# Patient Record
Sex: Female | Born: 1995 | Race: White | Hispanic: No | Marital: Married | Smoking: Never smoker
Health system: Southern US, Community
[De-identification: ages and names within clinical notes are randomized; demographics above are authoritative.]

## PROBLEM LIST (undated history)

## (undated) DIAGNOSIS — I471 Supraventricular tachycardia, unspecified: Secondary | ICD-10-CM

## (undated) DIAGNOSIS — F32A Depression, unspecified: Secondary | ICD-10-CM

## (undated) DIAGNOSIS — F419 Anxiety disorder, unspecified: Secondary | ICD-10-CM

## (undated) HISTORY — PX: CLEFT LIP REPAIR: SUR1164

## (undated) HISTORY — DX: Depression, unspecified: F32.A

## (undated) HISTORY — PX: TYMPANOSTOMY TUBE PLACEMENT: SHX32

## (undated) HISTORY — DX: Supraventricular tachycardia: I47.1

## (undated) HISTORY — PX: CLEFT PALATE REPAIR: SUR1165

## (undated) HISTORY — DX: Supraventricular tachycardia, unspecified: I47.10

## (undated) HISTORY — PX: TONSILLECTOMY AND ADENOIDECTOMY: SUR1326

## (undated) HISTORY — DX: Anxiety disorder, unspecified: F41.9

---

## 2013-01-27 DIAGNOSIS — K589 Irritable bowel syndrome without diarrhea: Secondary | ICD-10-CM | POA: Insufficient documentation

## 2016-08-02 DIAGNOSIS — F32A Depression, unspecified: Secondary | ICD-10-CM | POA: Insufficient documentation

## 2016-08-02 DIAGNOSIS — F329 Major depressive disorder, single episode, unspecified: Secondary | ICD-10-CM | POA: Insufficient documentation

## 2016-08-30 DIAGNOSIS — D509 Iron deficiency anemia, unspecified: Secondary | ICD-10-CM | POA: Insufficient documentation

## 2016-12-07 DIAGNOSIS — Z8349 Family history of other endocrine, nutritional and metabolic diseases: Secondary | ICD-10-CM | POA: Insufficient documentation

## 2016-12-20 DIAGNOSIS — G43019 Migraine without aura, intractable, without status migrainosus: Secondary | ICD-10-CM | POA: Insufficient documentation

## 2017-09-12 DIAGNOSIS — G43109 Migraine with aura, not intractable, without status migrainosus: Secondary | ICD-10-CM | POA: Insufficient documentation

## 2018-02-15 DIAGNOSIS — E559 Vitamin D deficiency, unspecified: Secondary | ICD-10-CM | POA: Insufficient documentation

## 2018-02-15 DIAGNOSIS — K529 Noninfective gastroenteritis and colitis, unspecified: Secondary | ICD-10-CM | POA: Insufficient documentation

## 2019-06-13 HISTORY — PX: ABLATION: SHX5711

## 2019-09-10 DIAGNOSIS — D649 Anemia, unspecified: Secondary | ICD-10-CM | POA: Insufficient documentation

## 2019-09-10 DIAGNOSIS — N838 Other noninflammatory disorders of ovary, fallopian tube and broad ligament: Secondary | ICD-10-CM | POA: Insufficient documentation

## 2019-09-10 DIAGNOSIS — N926 Irregular menstruation, unspecified: Secondary | ICD-10-CM | POA: Insufficient documentation

## 2019-09-10 DIAGNOSIS — H9191 Unspecified hearing loss, right ear: Secondary | ICD-10-CM | POA: Insufficient documentation

## 2019-09-23 DIAGNOSIS — I471 Supraventricular tachycardia: Secondary | ICD-10-CM | POA: Insufficient documentation

## 2020-04-09 ENCOUNTER — Telehealth: Payer: Self-pay | Admitting: General Practice

## 2020-04-09 NOTE — Telephone Encounter (Signed)
Patient was scheduled on 04/20/2020, but provider will be out of the office. Two call attempts made and letter has been mailed.

## 2020-04-20 ENCOUNTER — Ambulatory Visit: Payer: Self-pay | Admitting: Family Medicine

## 2020-05-27 ENCOUNTER — Telehealth: Payer: Self-pay

## 2020-05-27 NOTE — Telephone Encounter (Signed)
NOTES ON FILE FROM [facility] [phone], SENT REFERRAL TO SCHEDULING

## 2020-06-20 NOTE — Progress Notes (Signed)
Cardiology Office Note:   Date:  06/23/2020  NAME:  Shelby Castillo    MRN: [id number] DOB:  1995/10/06   PCP:  Patient, No Pcp Per  Cardiologist:  No primary care provider on file.   Referring MD: No ref. provider found   Chief Complaint  Patient presents with  . Irregular Heart Beat   History of Present Illness:   Shelby Castillo is a 25 y.o. female with a hx of SVT who is being seen today for the evaluation of tachycardia. History of supraventricular tachycardia diagnosed in [facility].  I did review the records she had a loop recorder placed and apparently had SVT episodes.  She did have SVT episodes with heart rates of around 200 bpm.  Echocardiogram reported as normal EF 60%.  Exercise tolerance test result is normal in 2018 as well.  She underwent AV nodal reentrant tachycardia ablation in 2021.  She reports her symptoms have improved.  She reports she may experience palpitations that occur once per month.  She still has her loop recorder in place.  Interrogations are going back to [facility].  She reports symptoms have improved since the ablation.  She was having episodes daily but now with symptoms once a month.  They are described as rapid heartbeat sensation that lasts up to 5 minutes.  They occur randomly with out identifiable trigger.  They are alleviated with deep breathing and relaxation.  She reports nothing she really does makes them better.  She denies any chest pain or shortness of breath.  She has no other medical problems.  No family history of heart disease.  She does not smoke, drink or use drugs.  She moved to [place] [facility] for work.  She has 1 son.  She is married.  She denies any chest pain, shortness of breath or palpitations today.  Her EKG today demonstrates normal sinus rhythm with no acute ischemic changes or evidence of prior infarction  Past Medical History: Past Medical History:  Diagnosis Date  . SVT (supraventricular tachycardia) (HCC)    Past  Surgical History: Past Surgical History:  Procedure Laterality Date  . ABLATION  2021    Current Medications: No outpatient medications have been marked as taking for the 06/23/20 encounter (Office Visit) with Sande Rives, MD.    Allergies:    Patient has no allergy information on record.   Social History: Social History   Socioeconomic History  . Marital status: Married    Spouse name: Not on file  . Number of children: 1  . Years of education: Not on file  . Highest education level: Not on file  Occupational History  . Occupation: unemployed  Tobacco Use  . Smoking status: Never Smoker  . Smokeless tobacco: Never Used  Substance and Sexual Activity  . Alcohol use: Never  . Drug use: Never  . Sexual activity: Not on file  Other Topics Concern  . Not on file  Social History Narrative  . Not on file   Social Determinants of Health   Financial Resource Strain: Not on file  Food Insecurity: Not on file  Transportation Needs: Not on file  Physical Activity: Not on file  Stress: Not on file  Social Connections: Not on file    Family History: The patient's family history includes Multiple sclerosis in her mother.  ROS:   All other ROS reviewed and negative. Pertinent positives noted in the HPI.     EKGs/Labs/Other Studies Reviewed:   The following studies  were personally reviewed by me today:  EKG:  EKG is ordered today.  The ekg ordered today demonstrates normal sinus rhythm heart rate 75, no acute ischemic changes, no evidence of prior infarction, and was personally reviewed by me.   Recent Labs: No results found for requested labs within last 8760 hours.   Recent Lipid Panel No results found for: CHOL, TRIG, HDL, CHOLHDL, VLDL, LDLCALC, LDLDIRECT  Physical Exam:   VS:  BP 128/80   Pulse 75   Ht 5\' 7"  (1.702 m)   Wt 161 lb (73 kg)   BMI 25.22 kg/m    Wt Readings from Last 3 Encounters:  06/23/20 161 lb (73 kg)    General: Well nourished,  well developed, in no acute distress Head: Atraumatic, normal size  Eyes: PEERLA, EOMI  Neck: Supple, no JVD Endocrine: No thryomegaly Cardiac: Normal S1, S2; RRR; no murmurs, rubs, or gallops Lungs: Clear to auscultation bilaterally, no wheezing, rhonchi or rales  Abd: Soft, nontender, no hepatomegaly  Ext: No edema, pulses 2+ Musculoskeletal: No deformities, BUE and BLE strength normal and equal Skin: Warm and dry, no rashes   Neuro: Alert and oriented to person, place, time, and situation, CNII-XII grossly intact, no focal deficits  Psych: Normal mood and affect   ASSESSMENT:   Shelby Castillo is a 25 y.o. female who presents for the following: 1. SVT (supraventricular tachycardia) (HCC)   2. Tachycardia     PLAN:   1. SVT (supraventricular tachycardia) (HCC) -Recently relocated from [facility].  History of SVT.  She had a loop recorder implanted on 05/15/2018.  She underwent SVT ablation in 2021.  Symptoms are occurring once per month and last minutes.  They are much improved. -We will set her up for remote interrogations here in this office with Dr. 2022.  She will continue to loop recorder for 1 more year.  She has 1 more year battery life.  We will then have it removed. -I doubt her symptoms are related to recurrent SVT.  The procedure is nearly 99.9% effective.  Symptoms could be stress or anxiety related.  We will see what her transmission show.   Disposition: Return in about 1 year (around 06/23/2021).  Medication Adjustments/Labs and Tests Ordered: Current medicines are reviewed at length with the patient today.  Concerns regarding medicines are outlined above.  Orders Placed This Encounter  Procedures  . EKG 12-Lead   No orders of the defined types were placed in this encounter.   Patient Instructions  Medication Instructions:  The current medical regimen is effective;  continue present plan and medications.  *If you need a refill on your cardiac  medications before your next appointment, please call your pharmacy*  Follow-Up: At [facility], you and your health needs are our priority.  As part of our continuing mission to provide you with exceptional heart care, we have created designated Provider Care Teams.  These Care Teams include your primary Cardiologist (physician) and Advanced Practice Providers (APPs -  Physician Assistants and Nurse Practitioners) who all work together to provide you with the care you need, when you need it.  We recommend signing up for the patient portal called "MyChart".  Sign up information is provided on this After Visit Summary.  MyChart is used to connect with patients for Virtual Visits (Telemedicine).  Patients are able to view lab/test results, encounter notes, upcoming appointments, etc.  Non-urgent messages can be sent to your provider as well.   To learn more  about what you can do with MyChart, go to [email or web address].    Your next appointment:   12 month(s)  The format for your next appointment:   In Person  Provider:   Lennie Odor, MD        Signed, Lenna Gilford. Flora Lipps, MD [facility]  [address], [address] [place], [place] [address] [phone]  06/23/2020 2:09 PM

## 2020-06-23 ENCOUNTER — Telehealth: Payer: Self-pay | Admitting: Emergency Medicine

## 2020-06-23 ENCOUNTER — Other Ambulatory Visit: Payer: Self-pay

## 2020-06-23 ENCOUNTER — Ambulatory Visit (INDEPENDENT_AMBULATORY_CARE_PROVIDER_SITE_OTHER): Admitting: Cardiovascular Disease

## 2020-06-23 ENCOUNTER — Encounter: Payer: Self-pay | Admitting: Cardiovascular Disease

## 2020-06-23 VITALS — BP 128/80 | HR 75 | Ht 67.0 in | Wt 161.0 lb

## 2020-06-23 DIAGNOSIS — I471 Supraventricular tachycardia: Secondary | ICD-10-CM | POA: Diagnosis not present

## 2020-06-23 DIAGNOSIS — R000 Tachycardia, unspecified: Secondary | ICD-10-CM | POA: Diagnosis not present

## 2020-06-23 NOTE — Patient Instructions (Signed)

## 2020-06-23 NOTE — Telephone Encounter (Signed)
[facility] representative provided with patient info and referring clinic. Will have patient transferred into this clinic.

## 2020-06-23 NOTE — Telephone Encounter (Signed)
-----   Message from Sandi Mariscal, RN sent at 06/23/2020  2:02 PM EST ----- Hello this is a new patient of Dr. Val Eagle' Neals. She currently has a Forensic psychologist and will need to be set up with the device clinic. Dr. Royann Shivers has agreed to follow the downloads. She was previously seen by [facility] in [place].   The number is [phone] (only number I could find on the records).  Thank you, Misty Stanley

## 2020-06-29 NOTE — Telephone Encounter (Signed)
The patient is now in [facility] as of 06/29/20.

## 2020-08-10 ENCOUNTER — Other Ambulatory Visit: Payer: Self-pay

## 2020-08-11 ENCOUNTER — Encounter: Payer: Self-pay | Admitting: Nurse Practitioner

## 2020-08-11 ENCOUNTER — Ambulatory Visit: Admitting: Nurse Practitioner

## 2020-08-11 VITALS — BP 112/80 | HR 88 | Temp 97.6°F | Ht 66.0 in | Wt 163.4 lb

## 2020-08-11 DIAGNOSIS — F32A Depression, unspecified: Secondary | ICD-10-CM

## 2020-08-11 DIAGNOSIS — F329 Major depressive disorder, single episode, unspecified: Secondary | ICD-10-CM

## 2020-08-11 MED ORDER — BUSPIRONE HCL 7.5 MG PO TABS: 7.5000 mg | ORAL_TABLET | Freq: Three times a day (TID) | ORAL | 1 refills | Status: DC

## 2020-08-11 MED ORDER — SERTRALINE HCL 100 MG PO TABS: 150.0000 mg | ORAL_TABLET | Freq: Every day | ORAL | 5 refills | Status: DC

## 2020-08-11 NOTE — Patient Instructions (Addendum)
Increase zoloft to 150mg  (1.5tab) daily Start buspirone TID  Managing Anxiety, Adult After being diagnosed with an anxiety disorder, you may be relieved to know why you have felt or behaved a certain way. You may also feel overwhelmed about the treatment ahead and what it will mean for your life. With care and support, you can manage this condition and recover from it. How to manage lifestyle changes Managing stress and anxiety Stress is your body's reaction to life changes and events, both good and bad. Most stress will last just a few hours, but stress can be ongoing and can lead to more than just stress. Although stress can play a major role in anxiety, it is not the same as anxiety. Stress is usually caused by something external, such as a deadline, test, or competition. Stress normally passes after the triggering event has ended.  Anxiety is caused by something internal, such as imagining a terrible outcome or worrying that something will go wrong that will devastate you. Anxiety often does not go away even after the triggering event is over, and it can become long-term (chronic) worry. It is important to understand the differences between stress and anxiety and to manage your stress effectively so that it does not lead to an anxious response. Talk with your health care provider or a counselor to learn more about reducing anxiety and stress. He or she may suggest tension reduction techniques, such as:  Music therapy. This can include creating or listening to music that you enjoy and that inspires you.  Mindfulness-based meditation. This involves being aware of your normal breaths while not trying to control your breathing. It can be done while sitting or walking.  Centering prayer. This involves focusing on a word, phrase, or sacred image that means something to you and brings you peace.  Deep breathing. To do this, expand your stomach and inhale slowly through your nose. Hold your breath for  3-5 seconds. Then exhale slowly, letting your stomach muscles relax.  Self-talk. This involves identifying thought patterns that lead to anxiety reactions and changing those patterns.  Muscle relaxation. This involves tensing muscles and then relaxing them. Choose a tension reduction technique that suits your lifestyle and personality. These techniques take time and practice. Set aside 5-15 minutes a day to do them. Therapists can offer counseling and training in these techniques. The training to help with anxiety may be covered by some insurance plans. Other things you can do to manage stress and anxiety include:  Keeping a stress/anxiety diary. This can help you learn what triggers your reaction and then learn ways to manage your response.  Thinking about how you react to certain situations. You may not be able to control everything, but you can control your response.  Making time for activities that help you relax and not feeling guilty about spending your time in this way.  Visual imagery and yoga can help you stay calm and relax.   Medicines Medicines can help ease symptoms. Medicines for anxiety include:  Anti-anxiety drugs.  Antidepressants. Medicines are often used as a primary treatment for anxiety disorder. Medicines will be prescribed by a health care provider. When used together, medicines, psychotherapy, and tension reduction techniques may be the most effective treatment. Relationships Relationships can play a big part in helping you recover. Try to spend more time connecting with trusted friends and family members. Consider going to couples counseling, taking family education classes, or going to family therapy. Therapy can help you and others  better understand your condition. How to recognize changes in your anxiety Everyone responds differently to treatment for anxiety. Recovery from anxiety happens when symptoms decrease and stop interfering with your daily activities at  home or work. This may mean that you will start to:  Have better concentration and focus. Worry will interfere less in your daily thinking.  Sleep better.  Be less irritable.  Have more energy.  Have improved memory. It is important to recognize when your condition is getting worse. Contact your health care provider if your symptoms interfere with home or work and you feel like your condition is not improving. Follow these instructions at home: Activity  Exercise. Most adults should do the following: ? Exercise for at least 150 minutes each week. The exercise should increase your heart rate and make you sweat (moderate-intensity exercise). ? Strengthening exercises at least twice a week.  Get the right amount and quality of sleep. Most adults need 7-9 hours of sleep each night. Lifestyle  Eat a healthy diet that includes plenty of vegetables, fruits, whole grains, low-fat dairy products, and lean protein. Do not eat a lot of foods that are high in solid fats, added sugars, or salt.  Make choices that simplify your life.  Do not use any products that contain nicotine or tobacco, such as cigarettes, e-cigarettes, and chewing tobacco. If you need help quitting, ask your health care provider.  Avoid caffeine, alcohol, and certain over-the-counter cold medicines. These may make you feel worse. Ask your pharmacist which medicines to avoid.   General instructions  Take over-the-counter and prescription medicines only as told by your health care provider.  Keep all follow-up visits as told by your health care provider. This is important. Where to find support You can get help and support from these sources:  Self-help groups.  Online and [employer].  A trusted spiritual leader.  Couples counseling.  Family education classes.  Family therapy. Where to find more information You may find that joining a support group helps you deal with your anxiety. The following  sources can help you locate counselors or support groups near you:  Mental Health America: www.mentalhealthamerica.net  Anxiety and Depression Association of [place] (ADAA): ProgramCam.de  The [employer] on Mental Illness (NAMI): www.nami.org Contact a health care provider if you:  Have a hard time staying focused or finishing daily tasks.  Spend many hours a day feeling worried about everyday life.  Become exhausted by worry.  Start to have headaches, feel tense, or have nausea.  Urinate more than normal.  Have diarrhea. Get help right away if you have:  A racing heart and shortness of breath.  Thoughts of hurting yourself or others. If you ever feel like you may hurt yourself or others, or have thoughts about taking your own life, get help right away. You can go to your nearest emergency department or call:  Your local emergency services (911 in the [place]  A suicide crisis helpline, such as the National Suicide Prevention Lifeline at 437 353 6297. This is open 24 hours a day. Summary  Taking steps to learn and use tension reduction techniques can help calm you and help prevent triggering an anxiety reaction.  When used together, medicines, psychotherapy, and tension reduction techniques may be the most effective treatment.  Family, friends, and partners can play a big part in helping you recover from an anxiety disorder. This information is not intended to replace advice given to you by your health care provider. Make sure you discuss  any questions you have with your health care provider. Document Revised: 10/29/2018 Document Reviewed: 10/29/2018 Elsevier Patient Education  2021 [employer].

## 2020-08-11 NOTE — Progress Notes (Signed)
Subjective:  Patient ID: Shelby Castillo, female    DOB: 1995-08-14  Age: 25 y.o. MRN: [id number]  CC: Establish Care (New Patient/Would like to discuss anxiety and depression. Needs medication refills. )  HPI Relocated to  with husband and son.  Depressive disorder Chronic, worse in last 73months due to husband work related accident (arm amputation). Previous counseling through his job which has been helpful. She needs another referral through her insurance. Current use of zoloft 100mg  daily No illicit drug use, no ETOH use, no caffeine intake. No previous Suicide attempt, no previous IVC. Current thoughts of death and questions about self worth, bu no intention or plan of self harm. No current contraception. She wants to get pregnant, so does not want to take any medication that may be teratogenic.  Increase zoloft 150mg  daily and added buspar 7.5mg  TID Entered referral to psychology. F/up in 74month  Depression screen PHQ 2/9 08/11/2020  Decreased Interest 2  Down, Depressed, Hopeless 3  PHQ - 2 Score 5  Altered sleeping 3  Tired, decreased energy 3  Change in appetite 2  Feeling bad or failure about yourself  3  Moving slowly or fidgety/restless 2  Suicidal thoughts 1  PHQ-9 Score 19  Difficult doing work/chores Extremely dIfficult   GAD 7 : Generalized Anxiety Score 08/11/2020  Nervous, Anxious, on Edge 3  Control/stop worrying 3  Worry too much - different things 3  Trouble relaxing 3  Restless 3  Easily annoyed or irritable 3  Afraid - awful might happen 3  Total GAD 7 Score 21  Anxiety Difficulty Extremely difficult    Reviewed past Medical, Social and Family history today.  Outpatient Medications Prior to Visit  Medication Sig Dispense Refill  . sertraline (ZOLOFT) 100 MG tablet Take 100 mg by mouth daily.     No facility-administered medications prior to visit.    ROS See HPI  Objective:  BP 112/80 (BP Location: Left Arm, Patient Position: Sitting,  Cuff Size: Normal)   Pulse 88   Temp 97.6 F (36.4 C) (Temporal)   Ht 5\' 6"  (1.676 m)   Wt 163 lb 6.4 oz (74.1 kg)   LMP 08/09/2020 (Exact Date)   SpO2 99%   BMI 26.37 kg/m   Physical Exam Neurological:     Mental Status: She is alert.  Psychiatric:        Attention and Perception: Attention normal.        Mood and Affect: Mood normal.        Speech: Speech normal.        Behavior: Behavior is cooperative.        Thought Content: Thought content normal.        Cognition and Memory: Cognition normal.        Judgment: Judgment normal.    Assessment & Plan:  This visit occurred during the SARS-CoV-2 public health emergency.  Safety protocols were in place, including screening questions prior to the visit, additional usage of staff PPE, and extensive cleaning of exam room while observing appropriate contact time as indicated for disinfecting solutions.   Marieta was seen today for establish care.  Diagnoses and all orders for this visit:  Depressive disorder -     sertraline (ZOLOFT) 100 MG tablet; Take 1.5 tablets (150 mg total) by mouth daily. -     busPIRone (BUSPAR) 7.5 MG tablet; Take 1 tablet (7.5 mg total) by mouth 3 (three) times daily. -     Ambulatory referral to Psychology  Problem List Items Addressed This Visit      Other   Depressive disorder - Primary    Chronic, worse in last 4months due to husband work related accident (arm amputation). Previous counseling through his job which has been helpful. She needs another referral through her insurance. Current use of zoloft 100mg  daily No illicit drug use, no ETOH use, no caffeine intake. No previous Suicide attempt, no previous IVC. Current thoughts of death and questions about self worth, bu no intention or plan of self harm. No current contraception. She wants to get pregnant, so does not want to take any medication that may be teratogenic.  Increase zoloft 150mg  daily and added buspar 7.5mg  TID Entered  referral to psychology. F/up in 56month      Relevant Medications   sertraline (ZOLOFT) 100 MG tablet   busPIRone (BUSPAR) 7.5 MG tablet   Other Relevant Orders   Ambulatory referral to Psychology      Follow-up: Return in about 4 weeks (around 09/08/2020) for CPE (fasting) and depression f/up.  2month, NP

## 2020-08-15 ENCOUNTER — Encounter: Payer: Self-pay | Admitting: Nurse Practitioner

## 2020-08-15 NOTE — Assessment & Plan Note (Addendum)
Chronic, worse in last 40months due to husband work related accident (arm amputation). Previous counseling through his job which has been helpful. She needs another referral through her insurance. Current use of zoloft 100mg  daily No illicit drug use, no ETOH use, no caffeine intake. No previous Suicide attempt, no previous IVC. Current thoughts of death and questions about self worth, bu no intention or plan of self harm. No current contraception. She wants to get pregnant, so does not want to take any medication that may be teratogenic.  Increase zoloft 150mg  daily and added buspar 7.5mg  TID Entered referral to psychology. F/up in 79month

## 2020-08-19 ENCOUNTER — Encounter: Payer: Self-pay | Admitting: Nurse Practitioner

## 2020-08-19 DIAGNOSIS — G43109 Migraine with aura, not intractable, without status migrainosus: Secondary | ICD-10-CM

## 2020-08-25 ENCOUNTER — Encounter: Payer: Self-pay | Admitting: *Deleted

## 2020-08-25 ENCOUNTER — Telehealth: Payer: Self-pay | Admitting: Cardiovascular Disease

## 2020-08-25 NOTE — Telephone Encounter (Signed)
Patient called to follow up on a MyChart Message. 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

## 2020-08-25 NOTE — Telephone Encounter (Signed)
Letter written and placed in MyChart for pt to print.  Replied to pt's MyChart message letting her know.  Advised to let us know if she needs a signed copy.

## 2020-08-25 NOTE — Telephone Encounter (Signed)
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  No restrictions.  If a letter is needed please state she has no restrictions.  Gerri Spore T. Flora Lipps, MD, [facility]  [facility]  [address], [address] [place], [place] [address] [phone]  9:26 AM

## 2020-08-26 ENCOUNTER — Encounter: Payer: Self-pay | Admitting: Neurology

## 2020-09-14 ENCOUNTER — Ambulatory Visit: Admitting: Psychology

## 2020-09-27 ENCOUNTER — Other Ambulatory Visit: Payer: Self-pay

## 2020-09-28 ENCOUNTER — Encounter: Payer: Self-pay | Admitting: Nurse Practitioner

## 2020-09-28 ENCOUNTER — Ambulatory Visit (INDEPENDENT_AMBULATORY_CARE_PROVIDER_SITE_OTHER): Admitting: Nurse Practitioner

## 2020-09-28 VITALS — BP 100/70 | HR 84 | Temp 98.0°F | Ht 67.0 in | Wt 164.0 lb

## 2020-09-28 DIAGNOSIS — F329 Major depressive disorder, single episode, unspecified: Secondary | ICD-10-CM | POA: Diagnosis not present

## 2020-09-28 DIAGNOSIS — F32A Depression, unspecified: Secondary | ICD-10-CM

## 2020-09-28 DIAGNOSIS — E559 Vitamin D deficiency, unspecified: Secondary | ICD-10-CM

## 2020-09-28 DIAGNOSIS — D500 Iron deficiency anemia secondary to blood loss (chronic): Secondary | ICD-10-CM

## 2020-09-28 DIAGNOSIS — Z0000 Encounter for general adult medical examination without abnormal findings: Secondary | ICD-10-CM | POA: Diagnosis not present

## 2020-09-28 LAB — CBC WITH DIFFERENTIAL/PLATELET
Basophils Absolute: 0 10*3/uL (ref 0.0–0.1)
Basophils Relative: 0.4 % (ref 0.0–3.0)
Eosinophils Absolute: 0 10*3/uL (ref 0.0–0.7)
Eosinophils Relative: 0.5 % (ref 0.0–5.0)
HCT: 42.5 % (ref 36.0–46.0)
Hemoglobin: 14 g/dL (ref 12.0–15.0)
Lymphocytes Relative: 25.1 % (ref 12.0–46.0)
Lymphs Abs: 2 10*3/uL (ref 0.7–4.0)
MCHC: 33 g/dL (ref 30.0–36.0)
MCV: 81 fl (ref 78.0–100.0)
Monocytes Absolute: 0.4 10*3/uL (ref 0.1–1.0)
Monocytes Relative: 5.4 % (ref 3.0–12.0)
Neutro Abs: 5.6 10*3/uL (ref 1.4–7.7)
Neutrophils Relative %: 68.6 % (ref 43.0–77.0)
Platelets: 338 10*3/uL (ref 150.0–400.0)
RBC: 5.25 Mil/uL — ABNORMAL HIGH (ref 3.87–5.11)
RDW: 15.7 % — ABNORMAL HIGH (ref 11.5–15.5)
WBC: 8.1 10*3/uL (ref 4.0–10.5)

## 2020-09-28 LAB — COMPREHENSIVE METABOLIC PANEL
ALT: 9 U/L (ref 0–35)
AST: 16 U/L (ref 0–37)
Albumin: 4.7 g/dL (ref 3.5–5.2)
Alkaline Phosphatase: 67 U/L (ref 39–117)
BUN: 11 mg/dL (ref 6–23)
CO2: 26 mEq/L (ref 19–32)
Calcium: 9.8 mg/dL (ref 8.4–10.5)
Chloride: 100 mEq/L (ref 96–112)
Creatinine, Ser: 0.74 mg/dL (ref 0.40–1.20)
GFR: 111.59 mL/min (ref >60.00)
Glucose, Bld: 82 mg/dL (ref 70–99)
Potassium: 4.4 mEq/L (ref 3.5–5.1)
Sodium: 134 mEq/L — ABNORMAL LOW (ref 135–145)
Total Bilirubin: 0.5 mg/dL (ref 0.2–1.2)
Total Protein: 7.8 g/dL (ref 6.0–8.3)

## 2020-09-28 LAB — FERRITIN: Ferritin: 7.8 ng/mL — ABNORMAL LOW (ref 10.0–291.0)

## 2020-09-28 LAB — TSH: TSH: 2.54 u[IU]/mL (ref 0.35–4.50)

## 2020-09-28 MED ORDER — BUSPIRONE HCL 7.5 MG PO TABS: 7.5000 mg | ORAL_TABLET | Freq: Three times a day (TID) | ORAL | 1 refills | Status: AC

## 2020-09-28 MED ORDER — SERTRALINE HCL 100 MG PO TABS: 150.0000 mg | ORAL_TABLET | Freq: Every day | ORAL | 1 refills | Status: AC

## 2020-09-28 NOTE — Assessment & Plan Note (Signed)
Improved mood with current zoloft and buspar dose Has upcoming appt with therapist. Maintain current medication dose F/up in 64months

## 2020-09-28 NOTE — Progress Notes (Signed)
Subjective:    Patient ID: Shelby Castillo, female    DOB: 11/03/1995, 25 y.o.   MRN: [id number]  Patient presents today for CPE and eval of chronic conditions  HPI Depressive disorder Improved mood with current zoloft and buspar dose Has upcoming appt with therapist. Maintain current medication dose F/up in 18months  Sexual History (orientation,birth control, marital status, STD):deferred pelvic and breast exam to GYN per patient  Depression/Suicide: Depression screen [facility] 2/9 08/11/2020  Decreased Interest 2  Down, Depressed, Hopeless 3  PHQ - 2 Score 5  Altered sleeping 3  Tired, decreased energy 3  Change in appetite 2  Feeling bad or failure about yourself  3  Moving slowly or fidgety/restless 2  Suicidal thoughts 1  PHQ-9 Score 19  Difficult doing work/chores Extremely dIfficult   Vision:up to date  Dental:up to date  Immunizations: (TDAP, Hep C screen, Pneumovax, Influenza, zoster)  Health Maintenance  Topic Date Due  . Pap Smear  Never done  . Pap Smear  Never done  .  Hepatitis C: One time screening is recommended by Center for Disease Control  (CDC) for  adults born from [birth year] through 1967.   09/28/2021*  . HIV Screening  09/28/2021*  . Flu Shot  01/10/2021  . Tetanus Vaccine  05/20/2022  . HPV Vaccine  Completed  . COVID-19 Vaccine  Completed  *Topic was postponed. The date shown is not the original due date.   Diet:regular Exercise: walking Weight:  Wt Readings from Last 3 Encounters:  09/28/20 164 lb (74.4 kg)  08/11/20 163 lb 6.4 oz (74.1 kg)  06/23/20 161 lb (73 kg)   Fall Risk: Fall Risk  08/11/2020  Falls in the past year? 0  Number falls in past yr: 0  Injury with Fall? 0  Risk for fall due to : No Fall Risks  Follow up Falls evaluation completed   Medications and allergies reviewed with patient and updated if appropriate.  Patient Active Problem List   Diagnosis Date Noted  . SVT (supraventricular tachycardia) (HCC) 09/23/2019  .  Anemia, unspecified 09/10/2019  . Decreased hearing of right ear 09/10/2019  . Hypertrophy of ovary 09/10/2019  . Irregular periods 09/10/2019  . Chronic diarrhea 02/15/2018  . Vitamin D deficiency 02/15/2018  . Migraine with aura 09/12/2017  . Intractable migraine without aura and without status migrainosus 12/20/2016  . Family history of thyroid disease 12/07/2016  . Iron deficiency anemia 08/30/2016  . Depressive disorder 08/02/2016  . Irritable bowel syndrome 01/27/2013    Current Outpatient Medications on File Prior to Visit  Medication Sig Dispense Refill  . busPIRone (BUSPAR) 7.5 MG tablet Take 1 tablet (7.5 mg total) by mouth 3 (three) times daily. 90 tablet 1  . sertraline (ZOLOFT) 100 MG tablet Take 1.5 tablets (150 mg total) by mouth daily. 60 tablet 5   No current facility-administered medications on file prior to visit.    Past Medical History:  Diagnosis Date  . Anxiety   . Depression   . SVT (supraventricular tachycardia) (HCC)     Past Surgical History:  Procedure Laterality Date  . ABLATION  2021  . CLEFT LIP REPAIR     2009  . CLEFT PALATE REPAIR     2009  . TONSILLECTOMY AND ADENOIDECTOMY     2016  . TYMPANOSTOMY TUBE PLACEMENT      Social History   Socioeconomic History  . Marital status: Married    Spouse name: Not on file  . Number of  children: 1  . Years of education: Not on file  . Highest education level: Not on file  Occupational History  . Not on file  Tobacco Use  . Smoking status: Never Smoker  . Smokeless tobacco: Never Used  Vaping Use  . Vaping Use: Never used  Substance and Sexual Activity  . Alcohol use: Never  . Drug use: Never  . Sexual activity: Yes    Birth control/protection: None  Other Topics Concern  . Not on file  Social History Narrative  . Not on file   Social Determinants of Health   Financial Resource Strain: Not on file  Food Insecurity: Not on file  Transportation Needs: Not on file  Physical  Activity: Not on file  Stress: Not on file  Social Connections: Not on file    Family History  Problem Relation Age of Onset  . Multiple sclerosis Mother   . Anxiety disorder Mother   . Cancer Mother 44       unsure of type  . Diabetes Brother         Review of Systems  Constitutional: Negative for fever, malaise/fatigue and weight loss.  HENT: Negative for congestion and sore throat.   Eyes:       Negative for visual changes  Respiratory: Negative for cough and shortness of breath.   Cardiovascular: Negative for chest pain, palpitations and leg swelling.  Gastrointestinal: Negative for blood in stool, constipation, diarrhea and heartburn.  Genitourinary: Negative for dysuria, frequency and urgency.  Musculoskeletal: Negative for falls, joint pain and myalgias.  Skin: Negative for rash.  Neurological: Negative for dizziness, sensory change and headaches.  Endo/Heme/Allergies: Does not bruise/bleed easily.  Psychiatric/Behavioral: Positive for depression. Negative for hallucinations, substance abuse and suicidal ideas. The patient is nervous/anxious. The patient does not have insomnia.     Objective:   Vitals:   09/28/20 1043  BP: 100/70  Pulse: 84  Temp: 98 F (36.7 C)  SpO2: 98%    Body mass index is 25.69 kg/m.   Physical Examination:  Physical Exam Vitals reviewed.  Constitutional:      General: She is not in acute distress.    Appearance: She is well-developed.  HENT:     Right Ear: Tympanic membrane, ear canal and external ear normal.     Left Ear: Tympanic membrane, ear canal and external ear normal.  Eyes:     Extraocular Movements: Extraocular movements intact.     Conjunctiva/sclera: Conjunctivae normal.  Cardiovascular:     Rate and Rhythm: Normal rate and regular rhythm.     Pulses: Normal pulses.     Heart sounds: Normal heart sounds.  Pulmonary:     Effort: Pulmonary effort is normal. No respiratory distress.     Breath sounds: Normal  breath sounds.  Chest:     Chest wall: No tenderness.  Abdominal:     General: Bowel sounds are normal.     Palpations: Abdomen is soft.  Genitourinary:    Comments: Breast and pelvic exam deferred to GYN Musculoskeletal:        General: Normal range of motion.     Right lower leg: No edema.     Left lower leg: No edema.  Skin:    General: Skin is warm and dry.  Neurological:     Mental Status: She is alert and oriented to person, place, and time.     Deep Tendon Reflexes: Reflexes are normal and symmetric.  Psychiatric:  Mood and Affect: Mood normal.        Behavior: Behavior normal.        Thought Content: Thought content normal.    ASSESSMENT and PLAN: This visit occurred during the SARS-CoV-2 public health emergency.  Safety protocols were in place, including screening questions prior to the visit, additional usage of staff PPE, and extensive cleaning of exam room while observing appropriate contact time as indicated for disinfecting solutions.   Ramaya was seen today for annual exam.  Diagnoses and all orders for this visit:  Preventative health care -     CBC with Differential/Platelet -     TSH -     Comprehensive metabolic panel  Iron deficiency anemia due to chronic blood loss -     Ferritin  Vitamin D deficiency -     Vitamin D 1,25 dihydroxy  Depressive disorder      Problem List Items Addressed This Visit      Other   Depressive disorder    Improved mood with current zoloft and buspar dose Has upcoming appt with therapist. Maintain current medication dose F/up in 77months      Iron deficiency anemia   Relevant Orders   Ferritin   Vitamin D deficiency   Relevant Orders   Vitamin D 1,25 dihydroxy    Other Visit Diagnoses    Preventative health care    -  Primary   Relevant Orders   CBC with Differential/Platelet   TSH   Comprehensive metabolic panel      Follow up: Return in about 3 months (around 12/28/2020) for Depression  ( , complete PHQ/GAD).  Alysia Penna, NP

## 2020-09-28 NOTE — Patient Instructions (Addendum)
Sign medical release form to have recent PAP results faxed to me.  Go to lab for blood draw  Preventive Care 27-27 Years Old, Female Preventive care refers to lifestyle choices and visits with your health care provider that can promote health and wellness. This includes:  A yearly physical exam. This is also called an annual wellness visit.  Regular dental and eye exams.  Immunizations.  Screening for certain conditions.  Healthy lifestyle choices, such as: ? Eating a healthy diet. ? Getting regular exercise. ? Not using drugs or products that contain nicotine and tobacco. ? Limiting alcohol use. What can I expect for my preventive care visit? Physical exam Your health care provider may check your:  Height and weight. These may be used to calculate your BMI (body mass index). BMI is a measurement that tells if you are at a healthy weight.  Heart rate and blood pressure.  Body temperature.  Skin for abnormal spots. Counseling Your health care provider may ask you questions about your:  Past medical problems.  Family's medical history.  Alcohol, tobacco, and drug use.  Emotional well-being.  Home life and relationship well-being.  Sexual activity.  Diet, exercise, and sleep habits.  Work and work Statistician.  Access to firearms.  Method of birth control.  Menstrual cycle.  Pregnancy history. What immunizations do I need? Vaccines are usually given at various ages, according to a schedule. Your health care provider will recommend vaccines for you based on your age, medical history, and lifestyle or other factors, such as travel or where you work.   What tests do I need? Blood tests  Lipid and cholesterol levels. These may be checked every 5 years starting at age 22.  Hepatitis C test.  Hepatitis B test. Screening  Diabetes screening. This is done by checking your blood sugar (glucose) after you have not eaten for a while (fasting).  STD (sexually  transmitted disease) testing, if you are at risk.  BRCA-related cancer screening. This may be done if you have a family history of breast, ovarian, tubal, or peritoneal cancers.  Pelvic exam and Pap test. This may be done every 3 years starting at age 71. Starting at age 78, this may be done every 5 years if you have a Pap test in combination with an HPV test. Talk with your health care provider about your test results, treatment options, and if necessary, the need for more tests.   Follow these instructions at home: Eating and drinking  Eat a healthy diet that includes fresh fruits and vegetables, whole grains, lean protein, and low-fat dairy products.  Take vitamin and mineral supplements as recommended by your health care provider.  Do not drink alcohol if: ? Your health care provider tells you not to drink. ? You are pregnant, may be pregnant, or are planning to become pregnant.  If you drink alcohol: ? Limit how much you have to 0-1 drink a day. ? Be aware of how much alcohol is in your drink. In the [place] one drink equals one 12 oz bottle of beer (355 mL), one 5 oz glass of wine (148 mL), or one 1 oz glass of hard liquor (44 mL).   Lifestyle  Take daily care of your teeth and gums. Brush your teeth every morning and night with fluoride toothpaste. Floss one time each day.  Stay active. Exercise for at least 30 minutes 5 or more days each week.  Do not use any products that contain nicotine or tobacco,  such as cigarettes, e-cigarettes, and chewing tobacco. If you need help quitting, ask your health care provider.  Do not use drugs.  If you are sexually active, practice safe sex. Use a condom or other form of protection to prevent STIs (sexually transmitted infections).  If you do not wish to become pregnant, use a form of birth control. If you plan to become pregnant, see your health care provider for a prepregnancy visit.  Find healthy ways to cope with stress, such  as: ? Meditation, yoga, or listening to music. ? Journaling. ? Talking to a trusted person. ? Spending time with friends and family. Safety  Always wear your seat belt while driving or riding in a vehicle.  Do not drive: ? If you have been drinking alcohol. Do not ride with someone who has been drinking. ? When you are tired or distracted. ? While texting.  Wear a helmet and other protective equipment during sports activities.  If you have firearms in your house, make sure you follow all gun safety procedures.  Seek help if you have been physically or sexually abused. What's next?  Go to your health care provider once a year for an annual wellness visit.  Ask your health care provider how often you should have your eyes and teeth checked.  Stay up to date on all vaccines. This information is not intended to replace advice given to you by your health care provider. Make sure you discuss any questions you have with your health care provider. Document Revised: 01/25/2020 Document Reviewed: 02/07/2018 Elsevier Patient Education  2021 [employer].

## 2020-10-01 LAB — VITAMIN D 1,25 DIHYDROXY
Vitamin D 1, 25 (OH)2 Total: 41 pg/mL (ref 18–72)
Vitamin D2 1, 25 (OH)2: <8 pg/mL
Vitamin D3 1, 25 (OH)2: 41 pg/mL

## 2020-10-13 NOTE — Telephone Encounter (Signed)
Pt missed her cb. CB please.

## 2020-10-13 NOTE — Telephone Encounter (Signed)
Patient would like the nurse to call her regarding forms that were faxed for her. She has some questions about them. Please give her a call back at [phone].

## 2020-10-28 NOTE — Progress Notes (Signed)
NEUROLOGY CONSULTATION NOTE  Shelby Castillo MRN: [id number] DOB: 03-03-1996  Referring provider: Anne Ng, NP Primary care provider: Anne Ng, NP  Reason for consult:  headaches  Assessment/Plan:   1.  Chronic migraine with aura 2.  Abnormal brain MRI - single punctate focus (nonspecific and may be related to migraine, but patient with family history of MS) as well as cysts 3.  Bilateral hand numbness  1.  MRI of brain with and without contrast to follow up on previous findings 2.  NCV-EMG upper extremities 3.  Migraine prevention:  Magnesium citrate 400mg , riboflavin 400mg  daily and coQ10 100mg  TID 4.  Migraine rescue:  She will try samples of Ubrelvy 100mg  5.  Limit use of pain relievers to no more than 2 days out of week to prevent risk of rebound or medication-overuse headache. 6.  Keep headache diary 7.  Follow up 6 months.   Subjective:  Shelby Castillo is a 25 year old right-handed female with depression/anxiety who presents for headaches.  History supplemented by PCP's and prior neurologist's notes.  She has had daily migraines since her early 69os.  It is a moderate pressure on top of head or throbbing over right eye, associated with visual aura (static/blurred/black spots) and photophobia but no nausea, vomiting, phonophobia, numbness or weakness.  Duration unknown as she sleeps it off.  No known triggers.  Pressing the base of her skull helps relieve it.  She takes ibuprofen but not often.  She also reports numbness and tingling in her hands.  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  MRI of the cervical spine with and without contrast on 07/08/2019 showed normal cord and minimal cervical spondylosis with mild right neuroforaminal narrowing at C3-4 and mild  left neuroforaminal narrowing at C4-5.   She tries to avoid prescription medications due to potential side effects. Current NSAIDS/analgesics:  Ibuprofen (every day or every other day) Current triptans:  none Current ergotamine:  none Current anti-emetic:  none Current muscle relaxants:  none Current Antihypertensive medications:  none Current Antidepressant medications:  Sertraline 200mg  Current Anticonvulsant medications:  none Current anti-CGRP:  none Current Vitamins/Herbal/Supplements:  none Current Antihistamines/Decongestants:  none Other therapy:  none Hormone/birth control:  none Other medications:  Buspar  Past NSAIDS/analgesics:  Tylenol Past abortive triptans:  none Past abortive ergotamine:  none Past muscle relaxants:  none Past anti-emetic:  none Past antihypertensive medications:  none Past antidepressant medications:  none Past anticonvulsant medications:  none Past anti-CGRP:  none Past vitamins/Herbal/Supplements:  none Past antihistamines/decongestants:  none Other past therapies:  none  Caffeine:  1 Coke or Mt Dew daily.  No coffee Diet:  Needs to increase water intake.  Sometimes skips meals.  No specific diet. Exercise:  No routine exercise routine Depression:  yes; Anxiety:  yes Other pain:  Elbow pain.  Sometimes fingers tingle, back sometimes sore if pressed on Sleep hygiene:  good Family history of headache:  Aunt (migraines).  Mother has multiple sclerosis   CMP from April demonstrated low Na of 134 but otherwise unremarkable.   PAST MEDICAL HISTORY: Past Medical History:  Diagnosis Date  . Anxiety   . Depression   . SVT (supraventricular tachycardia) (HCC)     PAST SURGICAL HISTORY: Past Surgical History:  Procedure Laterality  Date  . ABLATION  2021  . CLEFT LIP REPAIR     2009  . CLEFT PALATE REPAIR     2009  . TONSILLECTOMY AND ADENOIDECTOMY     2016  . TYMPANOSTOMY TUBE PLACEMENT      MEDICATIONS: Current Outpatient  Medications on File Prior to Visit  Medication Sig Dispense Refill  . busPIRone (BUSPAR) 7.5 MG tablet Take 1 tablet (7.5 mg total) by mouth 3 (three) times daily. 90 tablet 1  . sertraline (ZOLOFT) 100 MG tablet Take 1.5 tablets (150 mg total) by mouth daily. 180 tablet 1   No current facility-administered medications on file prior to visit.    ALLERGIES: No Known Allergies  FAMILY HISTORY: Family History  Problem Relation Age of Onset  . Multiple sclerosis Mother   . Anxiety disorder Mother   . Cancer Mother 58       unsure of type  . Diabetes Brother     Objective:  Blood pressure 120/76, pulse 83, height 5\' 7"  (1.702 m), weight 166 lb 9.6 oz (75.6 kg), SpO2 97 %. General: No acute distress.  Patient appears well-groomed.   Head:  Normocephalic/atraumatic Eyes:  fundi examined but not visualized Neck: supple, no paraspinal tenderness, full range of motion Back: No paraspinal tenderness Heart: regular rate and rhythm Lungs: Clear to auscultation bilaterally. Vascular: No carotid bruits. Neurological Exam: Mental status: alert and oriented to person, place, and time, recent and remote memory intact, fund of knowledge intact, attention and concentration intact, speech fluent and not dysarthric, language intact. Cranial nerves: CN I: not tested CN II: pupils equal, round and reactive to light, visual fields intact CN III, IV, VI:  full range of motion, no nystagmus, no ptosis CN V: facial sensation intact. CN VII: upper and lower face symmetric CN VIII: hearing intact CN IX, X: gag intact, uvula midline CN XI: sternocleidomastoid and trapezius muscles intact CN XII: tongue midline Bulk & Tone: normal, no fasciculations. Motor:  muscle strength 5/5 throughout Sensation:  Pinprick, temperature and vibratory sensation intact. Deep Tendon Reflexes:  2+ throughout,  toes downgoing.   Finger to nose testing:  Without dysmetria.   Heel to shin:  Without dysmetria.   Gait:   Normal station and stride.  Romberg negative.    Thank you for allowing me to take part in the care of this patient.  , DO  CC: Shon Millet, NP

## 2020-10-29 ENCOUNTER — Encounter: Payer: Self-pay | Admitting: Neurology

## 2020-10-29 ENCOUNTER — Ambulatory Visit: Admitting: Neurology

## 2020-10-29 ENCOUNTER — Other Ambulatory Visit: Payer: Self-pay

## 2020-10-29 VITALS — BP 120/76 | HR 83 | Ht 67.0 in | Wt 166.6 lb

## 2020-10-29 DIAGNOSIS — G43109 Migraine with aura, not intractable, without status migrainosus: Secondary | ICD-10-CM | POA: Diagnosis not present

## 2020-10-29 DIAGNOSIS — E348 Other specified endocrine disorders: Secondary | ICD-10-CM | POA: Diagnosis not present

## 2020-10-29 DIAGNOSIS — R9089 Other abnormal findings on diagnostic imaging of central nervous system: Secondary | ICD-10-CM | POA: Diagnosis not present

## 2020-10-29 DIAGNOSIS — R200 Anesthesia of skin: Secondary | ICD-10-CM

## 2020-10-29 NOTE — Patient Instructions (Signed)
  1. MRI of brain with and without contrast 2. Nerve study of arms 3.   magnesium citrate 400mg  daily, riboflavin 400mg  daily, coenzyme Q10 100mg  three times daily. 3. Take Ubrelvy 100mg  at earliest onset of headache.  May repeat dose once in 2 hours if needed.  Maximum 2 tablets in 24 hours.  If effective, contact me for prescription. 4. Limit use of pain relievers to no more than 2 days out of the week.  These medications include acetaminophen, NSAIDs (ibuprofen/Advil/Motrin, naproxen/Aleve, triptans (Imitrex/sumatriptan), Excedrin, and narcotics.  This will help reduce risk of rebound headaches. 5. Be aware of common food triggers:  - Caffeine:  coffee, black tea, cola, Mt. Dew  - Chocolate  - Dairy:  aged cheeses (brie, blue, cheddar, gouda, [place], provolone, [place], Swiss, etc), chocolate milk, buttermilk, sour cream, limit eggs and yogurt  - Nuts, peanut butter  - Alcohol  - Cereals/grains:  FRESH breads (fresh bagels, sourdough, doughnuts), yeast productions  - Processed/canned/aged/cured meats (pre-packaged deli meats, hotdogs)  - MSG/glutamate:  soy sauce, flavor enhancer, pickled/preserved/marinated foods  - Sweeteners:  aspartame (Equal, Nutrasweet).  Sugar and Splenda are okay  - Vegetables:  legumes (lima beans, lentils, snow peas, fava beans, pinto peans, peas, garbanzo beans), sauerkraut, onions, olives, pickles  - Fruit:  avocados, bananas, citrus fruit (orange, lemon, grapefruit), mango  - Other:  Frozen meals, macaroni and cheese 6. Routine exercise 7. Stay adequately hydrated (aim for 64 oz water daily) 8. Keep headache diary 9. Maintain proper stress management 10. Maintain proper sleep hygiene 11. Do not skip meals

## 2020-11-11 ENCOUNTER — Telehealth: Payer: Self-pay

## 2020-11-11 DIAGNOSIS — R9089 Other abnormal findings on diagnostic imaging of central nervous system: Secondary | ICD-10-CM

## 2020-11-11 DIAGNOSIS — R200 Anesthesia of skin: Secondary | ICD-10-CM

## 2020-11-11 DIAGNOSIS — G43109 Migraine with aura, not intractable, without status migrainosus: Secondary | ICD-10-CM

## 2020-11-11 DIAGNOSIS — E348 Other specified endocrine disorders: Secondary | ICD-10-CM

## 2020-11-11 NOTE — Telephone Encounter (Signed)
She has [employer] and I am waiting to be added to approved list to get PA's electronically. There is no phone number anywhere so I cannot call and do PA over the phone or fax. If you have any other ideas on how to do this let me know    Advised pt of note. We will try.

## 2020-11-11 NOTE — Telephone Encounter (Signed)
Orders needed advised pt we will work it and get it approved and [facility] will give her a call to  Schedule.

## 2020-11-15 ENCOUNTER — Encounter: Payer: Self-pay | Admitting: Nurse Practitioner

## 2020-11-15 DIAGNOSIS — D500 Iron deficiency anemia secondary to blood loss (chronic): Secondary | ICD-10-CM

## 2020-11-16 ENCOUNTER — Other Ambulatory Visit: Payer: Self-pay

## 2020-11-16 DIAGNOSIS — G43019 Migraine without aura, intractable, without status migrainosus: Secondary | ICD-10-CM

## 2020-11-16 MED ORDER — UBRELVY 100 MG PO TABS: 100.0000 mg | ORAL_TABLET | ORAL | 5 refills | Status: DC | PRN

## 2020-11-16 NOTE — Progress Notes (Signed)
This medication helped with my migraine/headaches. I would like a prescription for it if possible. Thank you! Script sent to pharmacy as pt request.

## 2020-11-16 NOTE — Telephone Encounter (Signed)
We can do that, Wes, but it will be after July 4.

## 2020-11-16 NOTE — Telephone Encounter (Signed)
Lisa:  Can you and Dr. Salena Saner get this removed for her?  Gerri Spore T. Flora Lipps, MD, [facility]  [facility]  [address] [place], [place] [address] [phone]  7:58 AM

## 2020-11-23 ENCOUNTER — Telehealth: Payer: Self-pay | Admitting: Hematology and Oncology

## 2020-11-23 NOTE — Telephone Encounter (Signed)
Scheduled appt per 6/10 referral. Pt aware.  

## 2020-11-27 ENCOUNTER — Ambulatory Visit
Admission: RE | Admit: 2020-11-27 | Discharge: 2020-11-27 | Disposition: A | Discharge status: Home / Self Care | Source: Ambulatory Visit | Attending: Neurology | Admitting: Neurology

## 2020-11-27 DIAGNOSIS — E348 Other specified endocrine disorders: Secondary | ICD-10-CM

## 2020-11-27 DIAGNOSIS — R200 Anesthesia of skin: Secondary | ICD-10-CM

## 2020-11-27 DIAGNOSIS — R9089 Other abnormal findings on diagnostic imaging of central nervous system: Secondary | ICD-10-CM

## 2020-11-27 DIAGNOSIS — G43109 Migraine with aura, not intractable, without status migrainosus: Secondary | ICD-10-CM

## 2020-11-27 MED ORDER — GADOBENATE DIMEGLUMINE 529 MG/ML IV SOLN
15.0000 mL | Freq: Once | INTRAVENOUS | Status: AC | PRN
  Administered 2020-11-27: 15 mL via INTRAVENOUS

## 2020-12-01 ENCOUNTER — Telehealth: Payer: Self-pay | Admitting: Neurology

## 2020-12-01 NOTE — Telephone Encounter (Signed)
Pt called in stating she missed a few calls from our office, she thinks it may be about results

## 2020-12-01 NOTE — Progress Notes (Signed)
Left message to call office

## 2020-12-01 NOTE — Telephone Encounter (Signed)
Patient called an advised  

## 2020-12-01 NOTE — Progress Notes (Signed)
No answer at 845 12/01/2020

## 2020-12-01 NOTE — Progress Notes (Signed)
Patient advised of MRI  

## 2020-12-02 ENCOUNTER — Other Ambulatory Visit: Payer: Self-pay

## 2020-12-02 ENCOUNTER — Inpatient Hospital Stay: Attending: Hematology and Oncology | Admitting: Hematology and Oncology

## 2020-12-02 ENCOUNTER — Inpatient Hospital Stay

## 2020-12-02 VITALS — BP 138/82 | HR 89 | Temp 97.6°F | Resp 18 | Ht 67.0 in | Wt 167.3 lb

## 2020-12-02 DIAGNOSIS — D509 Iron deficiency anemia, unspecified: Secondary | ICD-10-CM

## 2020-12-02 DIAGNOSIS — D500 Iron deficiency anemia secondary to blood loss (chronic): Secondary | ICD-10-CM

## 2020-12-02 DIAGNOSIS — F419 Anxiety disorder, unspecified: Secondary | ICD-10-CM | POA: Diagnosis not present

## 2020-12-02 DIAGNOSIS — Z809 Family history of malignant neoplasm, unspecified: Secondary | ICD-10-CM | POA: Diagnosis not present

## 2020-12-02 DIAGNOSIS — F32A Depression, unspecified: Secondary | ICD-10-CM | POA: Insufficient documentation

## 2020-12-02 LAB — CBC WITH DIFFERENTIAL/PLATELET
Abs Immature Granulocytes: 0.02 10*3/uL (ref 0.00–0.07)
Basophils Absolute: 0 10*3/uL (ref 0.0–0.1)
Basophils Relative: 0 %
Eosinophils Absolute: 0.1 10*3/uL (ref 0.0–0.5)
Eosinophils Relative: 1 %
HCT: 39.5 % (ref 36.0–46.0)
Hemoglobin: 12.8 g/dL (ref 12.0–15.0)
Immature Granulocytes: 0 %
Lymphocytes Relative: 22 %
Lymphs Abs: 1.8 10*3/uL (ref 0.7–4.0)
MCH: 26.9 pg (ref 26.0–34.0)
MCHC: 32.4 g/dL (ref 30.0–36.0)
MCV: 83.2 fL (ref 80.0–100.0)
Monocytes Absolute: 0.7 10*3/uL (ref 0.1–1.0)
Monocytes Relative: 8 %
Neutro Abs: 5.6 10*3/uL (ref 1.7–7.7)
Neutrophils Relative %: 69 %
Platelets: 309 10*3/uL (ref 150–400)
RBC: 4.75 MIL/uL (ref 3.87–5.11)
RDW: 15.5 % (ref 11.5–15.5)
WBC: 8.1 10*3/uL (ref 4.0–10.5)
nRBC: 0 % (ref 0.0–0.2)

## 2020-12-02 LAB — IRON AND TIBC
Iron: 31 ug/dL — ABNORMAL LOW (ref 41–142)
Saturation Ratios: 8 % — ABNORMAL LOW (ref 21–57)
TIBC: 390 ug/dL (ref 236–444)
UIBC: 359 ug/dL (ref 120–384)

## 2020-12-02 LAB — FERRITIN: Ferritin: 12 ng/mL (ref 11–307)

## 2020-12-02 LAB — HCG, SERUM, QUALITATIVE: Preg, Serum: NEGATIVE

## 2020-12-02 NOTE — Progress Notes (Signed)
[facility] CONSULT NOTE  Patient Care Team: Nche, [name], NP as PCP - General (Internal Medicine)  CHIEF COMPLAINTS/PURPOSE OF CONSULTATION:  IDA  ASSESSMENT & PLAN:  This is a very pleasant 25 year old female patient with no significant past medical history except for anxiety and depression referred to hematology for worsening iron deficiency and intolerance to oral iron.  Iron deficiency anemia affects a large proportion of the wellness population especially females of childbearing age and children.  Common causes of iron deficiency include blood loss, reduced iron absorption because of previous surgeries, dietary restrictions or other malabsorption issues, medications that reduce gastric acidity are due to inherited disorders such as IRIDA due to TMPRSS6 mutation. Symptoms of iron deficiency usually include fatigue, pica, restless legs, exercise intolerance, exertional dyspnea, headaches and weakness. Diagnosis can be usually made by history, physical examination, CBC and iron studies.  We generally treat iron deficiency anemia with oral supplements if this can be tolerated.  Since this patient cannot tolerate oral iron, I believe we can give her a trial of IV iron.   There are several formularies of intravenous iron available in the market.  We have discussed about risk of allergic/infusion reactions including potentially life-threatening anaphylaxis with intravenous iron however these serious allergic reactions are exceedingly rare and overestimated.  In contrast to serious allergic reactions, IV iron may be associated with nonallergic infusion reactions including self-limited urticaria, palpitations, dizziness, neck and back spasm which again occur in less than 1% of the individuals and do not progress to more serious reactions.  Serum pregnancy test is negative, she also denies any concerns for active pregnancy. I have ordered iron sucrose for intravenous iron  supplementation.  She will also return to see me in clinic in about 6 months.  She was encouraged to contact us with any other concerns or questions.  HISTORY OF PRESENTING ILLNESS:   Shelby Castillo 25 y.o. female is here because of IDA  This is a very pleasant 25 year old female patient with past medical history significant for anxiety and depression referred to hematology for evaluation of iron deficiency anemia.  Patient tells me that she feels tired, drained, but trying to manage her activities of daily living. No PICA noted. She does not have any overt shortness of breath with exertion.  She has ongoing menstrual cycles, does not necessarily describe them as heavy.  She did not notice any blood in her stool or blood in her urine. She has tried taking oral iron supplementation but she cannot tolerate them well at all. She was hoping we can give her some iron infusion. No history of gastric bypass surgery or malabsorption problems.  Rest of the pertinent 10 point ROS reviewed and as mentioned below.  REVIEW OF SYSTEMS:   Constitutional: Denies fevers, chills or abnormal night sweats Eyes: Denies blurriness of vision, double vision or watery eyes Ears, nose, mouth, throat, and face: Denies mucositis or sore throat Respiratory: Denies cough, dyspnea or wheezes Cardiovascular: Denies palpitation, chest discomfort or lower extremity swelling Gastrointestinal:  Denies nausea, heartburn or change in bowel habits Skin: Denies abnormal skin rashes Lymphatics: Denies new lymphadenopathy or easy bruising Neurological:Denies numbness, tingling or new weaknesses Behavioral/Psych: Mood is stable, no new changes  All other systems were reviewed with the patient and are negative.  MEDICAL HISTORY:  Past Medical History:  Diagnosis Date   Anxiety    Depression    SVT (supraventricular tachycardia) (HCC)     SURGICAL HISTORY: Past Surgical History:  Procedure Laterality Date   ABLATION  2021    CLEFT LIP REPAIR     2009   CLEFT PALATE REPAIR     2009   TONSILLECTOMY AND ADENOIDECTOMY     2016   TYMPANOSTOMY TUBE PLACEMENT      SOCIAL HISTORY: Social History   Socioeconomic History   Marital status: Married    Spouse name: Not on file   Number of children: 1   Years of education: Not on file   Highest education level: Not on file  Occupational History   Not on file  Tobacco Use   Smoking status: Never   Smokeless tobacco: Never  Vaping Use   Vaping Use: Never used  Substance and Sexual Activity   Alcohol use: Never   Drug use: Never   Sexual activity: Yes    Birth control/protection: None  Other Topics Concern   Not on file  Social History Narrative   Right handed   Social Determinants of Health   Financial Resource Strain: Not on file  Food Insecurity: Not on file  Transportation Needs: Not on file  Physical Activity: Not on file  Stress: Not on file  Social Connections: Not on file  Intimate Partner Violence: Not on file    FAMILY HISTORY: Family History  Problem Relation Age of Onset   Multiple sclerosis Mother    Anxiety disorder Mother    Cancer Mother 10       unsure of type   Diabetes Brother     ALLERGIES:  has No Known Allergies.  MEDICATIONS:  Current Outpatient Medications  Medication Sig Dispense Refill   sertraline (ZOLOFT) 100 MG tablet Take 1.5 tablets (150 mg total) by mouth daily. (Patient taking differently: Take 200 mg by mouth daily. Take one tablet daily) 180 tablet 1   Ubrogepant (UBRELVY) 100 MG TABS Take 100 mg by mouth as needed (take 1 tab as needed. MAx 2 tab in 24 hours). 16 tablet 5   busPIRone (BUSPAR) 7.5 MG tablet Take 1 tablet (7.5 mg total) by mouth 3 (three) times daily. 90 tablet 1   No current facility-administered medications for this visit.     PHYSICAL EXAMINATION: ECOG PERFORMANCE STATUS: 1 - Symptomatic but completely ambulatory  Vitals:   12/02/20 1345  BP: 138/82  Pulse: 89  Resp: 18   Temp: 97.6 F (36.4 C)  SpO2: 98%   Filed Weights   12/02/20 1345  Weight: 167 lb 4.8 oz (75.9 kg)    GENERAL:alert, no distress and comfortable SKIN: skin color, texture, turgor are normal, no rashes or significant lesions EYES: normal, conjunctiva are pink and non-injected, sclera clear OROPHARYNX:no exudate, no erythema and lips, buccal mucosa, and tongue normal  NECK: supple, thyroid normal size, non-tender, without nodularity LYMPH:  no palpable lymphadenopathy in the cervical, axillary or inguinal LUNGS: clear to auscultation and percussion with normal breathing effort HEART: regular rate & rhythm and no murmurs and no lower extremity edema ABDOMEN:abdomen soft, non-tender and normal bowel sounds Musculoskeletal:no cyanosis of digits and no clubbing  PSYCH: alert & oriented x 3 with fluent speech NEURO: no focal motor/sensory deficits  LABORATORY DATA:  I have reviewed the data as listed Lab Results  Component Value Date   WBC 8.1 09/28/2020   HGB 14.0 09/28/2020   HCT 42.5 09/28/2020   MCV 81.0 09/28/2020   PLT 338.0 09/28/2020     Chemistry      Component Value Date/Time   NA 134 (L) 09/28/2020 1106  K 4.4 09/28/2020 1106   CL 100 09/28/2020 1106   CO2 26 09/28/2020 1106   BUN 11 09/28/2020 1106   CREATININE 0.74 09/28/2020 1106      Component Value Date/Time   CALCIUM 9.8 09/28/2020 1106   ALKPHOS 67 09/28/2020 1106   AST 16 09/28/2020 1106   ALT 9 09/28/2020 1106   BILITOT 0.5 09/28/2020 1106       RADIOGRAPHIC STUDIES: I have personally reviewed the radiological images as listed and agreed with the findings in the report. MR BRAIN W WO CONTRAST  Result Date: 11/28/2020 CLINICAL DATA:  Chronic headache EXAM: MRI HEAD WITHOUT AND WITH CONTRAST TECHNIQUE: Multiplanar, multiecho pulse sequences of the brain and surrounding structures were obtained without and with intravenous contrast. CONTRAST:  81mL MULTIHANCE GADOBENATE DIMEGLUMINE 529 MG/ML IV  SOLN COMPARISON:  None. FINDINGS: Brain: No acute infarction, hemorrhage, hydrocephalus, extra-axial collection or mass lesion. Normal white matter. 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 Pineal gland is not enlarged on today's study. No mass-effect on the tectum. Vascular: Normal arterial flow voids Skull and upper cervical spine: No focal skeletal abnormality. Sinuses/Orbits: Negative Other: None IMPRESSION: Normal MRI head with contrast. Electronically Signed   By: Marlan Palau M.D.   On: 11/28/2020 15:58    All questions were answered. The patient knows to call the clinic with any problems, questions or concerns. I spent 45 minutes in the care of this patient including H and P, review of records, counseling and coordination of care.     Rachel Moulds, MD 12/02/2020 2:01 PM

## 2020-12-03 ENCOUNTER — Encounter: Payer: Self-pay | Admitting: Hematology and Oncology

## 2020-12-20 ENCOUNTER — Encounter: Payer: Self-pay | Admitting: Hematology and Oncology

## 2020-12-20 ENCOUNTER — Inpatient Hospital Stay: Attending: Hematology and Oncology

## 2020-12-20 ENCOUNTER — Ambulatory Visit: Admitting: Cardiovascular Disease

## 2020-12-20 ENCOUNTER — Other Ambulatory Visit: Payer: Self-pay

## 2020-12-20 VITALS — BP 123/80 | HR 94 | Temp 98.4°F | Resp 20

## 2020-12-20 DIAGNOSIS — Z95818 Presence of other cardiac implants and grafts: Secondary | ICD-10-CM

## 2020-12-20 DIAGNOSIS — D500 Iron deficiency anemia secondary to blood loss (chronic): Secondary | ICD-10-CM

## 2020-12-20 DIAGNOSIS — D509 Iron deficiency anemia, unspecified: Secondary | ICD-10-CM | POA: Insufficient documentation

## 2020-12-20 MED ORDER — LIDOCAINE-EPINEPHRINE 1 %-1:100000 IJ SOLN: 10.0000 mL | Freq: Once | INTRAMUSCULAR | Status: AC

## 2020-12-20 MED ORDER — SODIUM CHLORIDE 0.9 % IV SOLN
200.0000 mg | Freq: Once | INTRAVENOUS | Status: AC
  Administered 2020-12-20: 200 mg via INTRAVENOUS
  Filled 2020-12-20: qty 200

## 2020-12-20 MED ORDER — SODIUM CHLORIDE 0.9 % IV SOLN
Freq: Once | INTRAVENOUS | Status: AC
  Filled 2020-12-20: qty 250

## 2020-12-20 NOTE — Patient Instructions (Signed)
Discharge Instructions for  Loop Recorder Explant   Follow up: Keep your wound check appointment on 12/31/20 at noon. This will be a video visit only.  If you have any questions or concerns, please call the office at [phone].  ACTIVITY No restrictions. DO wear your seatbelt, even if it crosses over the site.   WOUND CARE Keep the wound area clean and dry.  Remove the dressing the day after (usually 24 hours after the procedure). DO NOT SUBMERGE UNDER WATER UNTIL FULLY HEALED (no tub baths, hot tubs, swimming pools, etc.).  You  may shower or take a sponge bath after the dressing is removed. DO NOT SOAK the area and do not allow the shower to directly spray on the site. If you have tape/steri-strips on your wound, these will fall off; do not pull them off prematurely.   No bandage is needed on the site.  DO  NOT apply any creams, oils, or ointments to the wound area. If you notice any drainage or discharge from the wound, any swelling, excessive redness or bruising at the site, or if you develop a fever > 101? F, call the office at once at [phone].

## 2020-12-20 NOTE — Progress Notes (Signed)
LOOP RECORDER EXPLANT   Procedure report  Procedure performed:  Loop recorder EXplantation   Reason for procedure:  1. SVT. Device no longer needed.  Prior to explantation the device was interrogated. A couple of artefactual "rate-drop" events were recorded (EGMs with normal findings), no recent SVT or any other arrhythmic events recorded in last year.  Procedure performed by:  Thurmon Fair, MD  Complications:  None  Estimated blood loss:  <5 mL  Medications administered during procedure:  Lidocaine 1% with 1/10,000 epinephrine 10 mL locally Device details:  Biotronik loop recorder Procedure details:  After the risks and benefits of the procedure were discussed the patient provided informed consent. The patient was prepped and draped in usual sterile fashion. Local anesthesia was administered to an area 2 cm to the left of the sternum in the 4th intercostal space, at the site of the implantation scar. A 1.5 cm cutaneous incision was made using a scalpel. The device was easily extracted with a hemostat. Local pressure was held to ensure hemostasis.  The incision was closed with SteriStrips and a sterile dressing was applied.   Thurmon Fair, MD, [facility] [phone] office [phone] pager 12/20/2020 4:16 PM

## 2020-12-20 NOTE — Patient Instructions (Addendum)

## 2020-12-21 ENCOUNTER — Encounter: Payer: Self-pay | Admitting: Hematology and Oncology

## 2020-12-21 ENCOUNTER — Ambulatory Visit: Admitting: Neurology

## 2020-12-21 DIAGNOSIS — G5603 Carpal tunnel syndrome, bilateral upper limbs: Secondary | ICD-10-CM

## 2020-12-21 NOTE — Procedures (Signed)
[facility]  [address] [place], [address]  [place], [place] [address] Tel: [phone] Fax:  [phone] Test Date:  12/21/2020  Patient: Shelby Castillo DOB: 1996-03-24 Physician: Nita Sickle, DO  Sex: Female Height: 5\' 7"  Ref Phys: , D.O.  ID#: Shon Millet   Technician:    Patient Complaints: This is a 25 year old female referred for evaluation of bilateral hand paresthesias.  NCV & EMG Findings: Extensive electrodiagnostic testing of the right upper extremity and additional study of the left shows:  Right median sensory response shows prolonged latency (3.4 ms).  Left mixed palmar sensory responses show prolonged latency.  Left median and bilateral ulnar sensory responses are within normal limits. Bilateral median and ulnar motor responses are within normal limits. There is no evidence of active or chronic motor axonal loss changes affecting any of the tested muscles.  Motor unit configuration and recruitment pattern is within normal limits.  Impression: Bilateral median neuropathy at or distal to the wrist, consistent with a clinical diagnosis of carpal tunnel syndrome.  Overall, these findings are mild in degree electrically, worse on the right.   ___________________________ 30, DO    Nerve Conduction Studies Anti Sensory Summary Table   Stim Site NR Peak (ms) Norm Peak (ms) P-T Amp (V) Norm P-T Amp  Left Median Anti Sensory (2nd Digit)  32C  Wrist    3.2 <3.3 59.6 >20  Right Median Anti Sensory (2nd Digit)  32C  Wrist    3.4 <3.3 51.7 >20  Left Ulnar Anti Sensory (5th Digit)  32C  Wrist    2.6 <3.0 43.0 >18  Right Ulnar Anti Sensory (5th Digit)  32C  Wrist    2.8 <3.0 51.8 >18   Motor Summary Table   Stim Site NR Onset (ms) Norm Onset (ms) O-P Amp (mV) Norm O-P Amp Site1 Site2 Delta-0 (ms) Dist (cm) Vel (m/s) Norm Vel (m/s)  Left Median Motor (Abd Poll Brev)  32C  Wrist    3.1 <3.9 10.8 >6 Elbow Wrist 4.6 26.0 57 >51  Elbow     7.7  10.3         Right Median Motor (Abd Poll Brev)  32C  Wrist    3.1 <3.9 10.8 >6 Elbow Wrist 4.4 26.0 59 >51  Elbow    7.5  10.6         Left Ulnar Motor (Abd Dig Minimi)  32C  Wrist    2.1 <3.0 13.2 >8 B Elbow Wrist 3.5 22.0 63 >51  B Elbow    5.6  12.0  A Elbow B Elbow 1.7 10.0 59 >51  A Elbow    7.3  11.6         Right Ulnar Motor (Abd Dig Minimi)  32C  Wrist    2.5 <3.0 13.5 >8 B Elbow Wrist 3.5 21.0 60 >51  B Elbow    6.0  13.4  A Elbow B Elbow 1.8 10.0 56 >51  A Elbow    7.8  13.4          Comparison Summary Table   Stim Site NR Peak (ms) Norm Peak (ms) P-T Amp (V) Site1 Site2 Delta-P (ms) Norm Delta (ms)  Left Median/Ulnar Palm Comparison (Wrist - 8cm)  32C  Median Palm    2.0 <2.2 98.4 Median Palm Ulnar Palm 0.5   Ulnar Palm    1.5 <2.2 27.2       EMG   Side Muscle Ins Act Fibs Psw Fasc Number  Recrt Dur Dur. Amp Amp. Poly Poly. Comment  Right 1stDorInt Nml Nml Nml Nml Nml Nml Nml Nml Nml Nml Nml Nml N/A  Right Abd Poll Brev Nml Nml Nml Nml Nml Nml Nml Nml Nml Nml Nml Nml N/A  Right PronatorTeres Nml Nml Nml Nml Nml Nml Nml Nml Nml Nml Nml Nml N/A  Right Biceps Nml Nml Nml Nml Nml Nml Nml Nml Nml Nml Nml Nml N/A  Right Triceps Nml Nml Nml Nml Nml Nml Nml Nml Nml Nml Nml Nml N/A  Right Deltoid Nml Nml Nml Nml Nml Nml Nml Nml Nml Nml Nml Nml N/A  Left 1stDorInt Nml Nml Nml Nml Nml Nml Nml Nml Nml Nml Nml Nml N/A  Left Abd Poll Brev Nml Nml Nml Nml Nml Nml Nml Nml Nml Nml Nml Nml N/A  Left PronatorTeres Nml Nml Nml Nml Nml Nml Nml Nml Nml Nml Nml Nml N/A  Left Biceps Nml Nml Nml Nml Nml Nml Nml Nml Nml Nml Nml Nml N/A  Left Triceps Nml Nml Nml Nml Nml Nml Nml Nml Nml Nml Nml Nml N/A  Left Deltoid Nml Nml Nml Nml Nml Nml Nml Nml Nml Nml Nml Nml N/A      Waveforms:

## 2020-12-22 ENCOUNTER — Other Ambulatory Visit: Payer: Self-pay

## 2020-12-22 ENCOUNTER — Encounter: Payer: Self-pay | Admitting: Hematology and Oncology

## 2020-12-22 ENCOUNTER — Inpatient Hospital Stay

## 2020-12-22 VITALS — BP 116/78 | HR 70 | Temp 98.4°F | Resp 16

## 2020-12-22 DIAGNOSIS — D509 Iron deficiency anemia, unspecified: Secondary | ICD-10-CM | POA: Diagnosis not present

## 2020-12-22 DIAGNOSIS — D500 Iron deficiency anemia secondary to blood loss (chronic): Secondary | ICD-10-CM

## 2020-12-22 MED ORDER — SODIUM CHLORIDE 0.9 % IV SOLN
200.0000 mg | Freq: Once | INTRAVENOUS | Status: AC
  Administered 2020-12-22: 200 mg via INTRAVENOUS
  Filled 2020-12-22: qty 200

## 2020-12-22 MED ORDER — SODIUM CHLORIDE 0.9 % IV SOLN
Freq: Once | INTRAVENOUS | Status: AC
Start: 2020-12-22 — End: 2020-12-22
  Filled 2020-12-22: qty 250

## 2020-12-23 ENCOUNTER — Encounter: Payer: Self-pay | Admitting: Hematology and Oncology

## 2020-12-24 ENCOUNTER — Inpatient Hospital Stay

## 2020-12-24 ENCOUNTER — Other Ambulatory Visit: Payer: Self-pay

## 2020-12-24 ENCOUNTER — Telehealth: Payer: Self-pay | Admitting: Neurology

## 2020-12-24 VITALS — BP 113/72 | HR 77 | Temp 98.6°F | Resp 16

## 2020-12-24 DIAGNOSIS — D509 Iron deficiency anemia, unspecified: Secondary | ICD-10-CM | POA: Diagnosis not present

## 2020-12-24 DIAGNOSIS — D500 Iron deficiency anemia secondary to blood loss (chronic): Secondary | ICD-10-CM

## 2020-12-24 MED ORDER — SODIUM CHLORIDE 0.9 % IV SOLN
200.0000 mg | Freq: Once | INTRAVENOUS | Status: AC
  Administered 2020-12-24: 200 mg via INTRAVENOUS
  Filled 2020-12-24: qty 200

## 2020-12-24 MED ORDER — SODIUM CHLORIDE 0.9 % IV SOLN
Freq: Once | INTRAVENOUS | Status: AC
  Filled 2020-12-24: qty 250

## 2020-12-24 NOTE — Telephone Encounter (Signed)
Advised pt PA started waiting on response. Will advise pt once received.

## 2020-12-24 NOTE — Patient Instructions (Signed)

## 2020-12-24 NOTE — Telephone Encounter (Signed)
Patient called for an update on her PA for her Shelby Castillo.

## 2020-12-24 NOTE — Progress Notes (Signed)
Shelby Castillo (Key: [id number]) - [id number] [name] 100MG  tablets     Status: PA Request  Created: July 15th, 2022 [phone]  Sent: July 15th, 2022

## 2020-12-27 ENCOUNTER — Other Ambulatory Visit: Payer: Self-pay

## 2020-12-27 ENCOUNTER — Inpatient Hospital Stay

## 2020-12-27 VITALS — BP 130/86 | HR 78 | Temp 98.1°F | Resp 18

## 2020-12-27 DIAGNOSIS — D509 Iron deficiency anemia, unspecified: Secondary | ICD-10-CM | POA: Diagnosis not present

## 2020-12-27 DIAGNOSIS — D500 Iron deficiency anemia secondary to blood loss (chronic): Secondary | ICD-10-CM

## 2020-12-27 MED ORDER — SODIUM CHLORIDE 0.9 % IV SOLN
Freq: Once | INTRAVENOUS | Status: AC
  Filled 2020-12-27: qty 250

## 2020-12-27 MED ORDER — SODIUM CHLORIDE 0.9 % IV SOLN
200.0000 mg | Freq: Once | INTRAVENOUS | Status: AC
  Administered 2020-12-27: 200 mg via INTRAVENOUS
  Filled 2020-12-27: qty 200

## 2020-12-27 NOTE — Patient Instructions (Signed)
[facility] MEDICAL ONCOLOGY  Discharge Instructions: Thank you for choosing [facility] to provide your oncology and hematology care.   If you have a lab appointment with the Cancer Center, please go directly to the Cancer Center and check in at the registration area.   Wear comfortable clothing and clothing appropriate for easy access to any Portacath or PICC line.   We strive to give you quality time with your provider. You may need to reschedule your appointment if you arrive late (15 or more minutes).  Arriving late affects you and other patients whose appointments are after yours.  Also, if you miss three or more appointments without notifying the office, you may be dismissed from the clinic at the provider's discretion.      For prescription refill requests, have your pharmacy contact our office and allow 72 hours for refills to be completed.    Today you received the following medication - Venofer      To help prevent nausea and vomiting after your treatment, we encourage you to take your nausea medication as directed.  BELOW ARE SYMPTOMS THAT SHOULD BE REPORTED IMMEDIATELY: *FEVER GREATER THAN 100.4 F (38 C) OR HIGHER *CHILLS OR SWEATING *NAUSEA AND VOMITING THAT IS NOT CONTROLLED WITH YOUR NAUSEA MEDICATION *UNUSUAL SHORTNESS OF BREATH *UNUSUAL BRUISING OR BLEEDING *URINARY PROBLEMS (pain or burning when urinating, or frequent urination) *BOWEL PROBLEMS (unusual diarrhea, constipation, pain near the anus) TENDERNESS IN MOUTH AND THROAT WITH OR WITHOUT PRESENCE OF ULCERS (sore throat, sores in mouth, or a toothache) UNUSUAL RASH, SWELLING OR PAIN  UNUSUAL VAGINAL DISCHARGE OR ITCHING   Items with * indicate a potential emergency and should be followed up as soon as possible or go to the Emergency Department if any problems should occur.  Please show the CHEMOTHERAPY ALERT CARD or IMMUNOTHERAPY ALERT CARD at check-in to the Emergency Department and  triage nurse.  Should you have questions after your visit or need to cancel or reschedule your appointment, please contact [facility]  Dept: [phone]  and follow the prompts.  Office hours are 8:00 a.m. to 4:30 p.m. Monday - Friday. Please note that voicemails left after 4:00 p.m. may not be returned until the following business day.  We are closed weekends and major holidays. You have access to a nurse at all times for urgent questions. Please call the main number to the clinic Dept: [phone] and follow the prompts.   For any non-urgent questions, you may also contact your provider using MyChart. We now offer e-Visits for anyone 18 and older to request care online for non-urgent symptoms. For details visit [email or web address].   Also download the MyChart app! Go to the app store, search "MyChart", open the app, select [facility], and log in with your MyChart username and password.  Due to Covid, a mask is required upon entering the hospital/clinic. If you do not have a mask, one will be given to you upon arrival. For doctor visits, patients may have 1 support person aged 18 or older with them. For treatment visits, patients cannot have anyone with them due to current Covid guidelines and our immunocompromised population.   Iron Sucrose injection What is this medication? IRON SUCROSE (AHY ern SOO krohs) is an iron complex. Iron is used to make healthy red blood cells, which carry oxygen and nutrients throughout the body. This medicine is used to treat iron deficiency anemia in people with chronickidney disease. This medicine may   be used for other purposes; ask your health care provider orpharmacist if you have questions. COMMON BRAND NAME(S): Venofer What should I tell my care team before I take this medication? They need to know if you have any of these conditions: anemia not caused by low iron levels heart disease high levels of iron in the  blood kidney disease liver disease an unusual or allergic reaction to iron, other medicines, foods, dyes, or preservatives pregnant or trying to get pregnant breast-feeding How should I use this medication? This medicine is for infusion into a vein. It is given by a health careprofessional in a hospital or clinic setting. Talk to your pediatrician regarding the use of this medicine in children. While this drug may be prescribed for children as young as 2 years for selectedconditions, precautions do apply. Overdosage: If you think you have taken too much of this medicine contact apoison control center or emergency room at once. NOTE: This medicine is only for you. Do not share this medicine with others. What if I miss a dose? It is important not to miss your dose. Call your doctor or health careprofessional if you are unable to keep an appointment. What may interact with this medication? Do not take this medicine with any of the following medications: deferoxamine dimercaprol other iron products This medicine may also interact with the following medications: chloramphenicol deferasirox This list may not describe all possible interactions. Give your health care provider a list of all the medicines, herbs, non-prescription drugs, or dietary supplements you use. Also tell them if you smoke, drink alcohol, or use illegaldrugs. Some items may interact with your medicine. What should I watch for while using this medication? Visit your doctor or healthcare professional regularly. Tell your doctor or healthcare professional if your symptoms do not start to get better or if theyget worse. You may need blood work done while you are taking this medicine. You may need to follow a special diet. Talk to your doctor. Foods that contain iron include: whole grains/cereals, dried fruits, beans, or peas, leafy greenvegetables, and organ meats (liver, kidney). What side effects may I notice from receiving this  medication? Side effects that you should report to your doctor or health care professionalas soon as possible: allergic reactions like skin rash, itching or hives, swelling of the face, lips, or tongue breathing problems changes in blood pressure cough fast, irregular heartbeat feeling faint or lightheaded, falls fever or chills flushing, sweating, or hot feelings joint or muscle aches/pains seizures swelling of the ankles or feet unusually weak or tired Side effects that usually do not require medical attention (report to yourdoctor or health care professional if they continue or are bothersome): diarrhea feeling achy headache irritation at site where injected nausea, vomiting stomach upset tiredness This list may not describe all possible side effects. Call your doctor for medical advice about side effects. You may report side effects to FDA at1-800-FDA-1088. Where should I keep my medication? This drug is given in a hospital or clinic and will not be stored at home. NOTE: This sheet is a summary. It may not cover all possible information. If you have questions about this medicine, talk to your doctor, pharmacist, orhealth care provider.  2022 Elsevier/Gold Standard (2011-03-09 17:14:35)   

## 2020-12-29 ENCOUNTER — Encounter: Payer: Self-pay | Admitting: Nurse Practitioner

## 2020-12-29 ENCOUNTER — Other Ambulatory Visit: Payer: Self-pay

## 2020-12-29 ENCOUNTER — Ambulatory Visit: Admitting: Nurse Practitioner

## 2020-12-29 ENCOUNTER — Inpatient Hospital Stay

## 2020-12-29 VITALS — BP 122/72 | HR 75 | Temp 98.0°F | Resp 17

## 2020-12-29 DIAGNOSIS — D509 Iron deficiency anemia, unspecified: Secondary | ICD-10-CM | POA: Diagnosis not present

## 2020-12-29 DIAGNOSIS — D500 Iron deficiency anemia secondary to blood loss (chronic): Secondary | ICD-10-CM

## 2020-12-29 MED ORDER — SODIUM CHLORIDE 0.9 % IV SOLN
200.0000 mg | Freq: Once | INTRAVENOUS | Status: AC
  Administered 2020-12-29: 200 mg via INTRAVENOUS
  Filled 2020-12-29: qty 200

## 2020-12-29 MED ORDER — SODIUM CHLORIDE 0.9 % IV SOLN
Freq: Once | INTRAVENOUS | Status: AC
  Filled 2020-12-29: qty 250

## 2020-12-29 NOTE — Patient Instructions (Signed)

## 2020-12-29 NOTE — Telephone Encounter (Signed)
Pt states she was told the form was not checked for yes or no if pts immunizations are up to date. Pt states needs updated/initialed and refaxed.

## 2020-12-30 NOTE — Telephone Encounter (Signed)
Patient notified VIA phone and form/immunization records faxed Attn: Zena Amos @ [phone]. Dm/cma

## 2020-12-31 ENCOUNTER — Telehealth: Admitting: Cardiovascular Disease

## 2020-12-31 ENCOUNTER — Other Ambulatory Visit: Payer: Self-pay

## 2021-01-10 ENCOUNTER — Other Ambulatory Visit: Payer: Self-pay | Admitting: Neurology

## 2021-01-10 MED ORDER — NURTEC 75 MG PO TBDP: 75.0000 mg | ORAL_TABLET | Freq: Every day | ORAL | 5 refills | Status: AC | PRN

## 2021-01-10 NOTE — Progress Notes (Signed)
Left message to check at pharmacy

## 2021-01-10 NOTE — Progress Notes (Signed)
Ubrelvy denied.  Please let patient know that I sent an alternative rescue prescription for Nurtec to her [facility] pharmacy in [place].  Will need to wait for prior authorization.

## 2021-05-13 ENCOUNTER — Ambulatory Visit: Admitting: Neurology

## 2021-06-01 NOTE — Progress Notes (Deleted)
[facility] CONSULT NOTE  Patient Care Team: Nche, [name], NP as PCP - General (Internal Medicine)  CHIEF COMPLAINTS/PURPOSE OF CONSULTATION:  IDA, follow up  ASSESSMENT & PLAN:  This is a very pleasant 25 year old female patient with no significant past medical history except for anxiety and depression referred to hematology for worsening iron deficiency and intolerance to oral iron. She received IV iron in July 2022.  She will also return to see me in clinic in about 6 months.  She was encouraged to contact us with any other concerns or questions.  HISTORY OF PRESENTING ILLNESS:   Shelby Castillo 25 y.o. female is here because of IDA  This is a very pleasant 25 year old female patient with past medical history significant for anxiety and depression referred to hematology for evaluation of iron deficiency anemia. She received IV iron in July 2022.  REVIEW OF SYSTEMS:   Constitutional: Denies fevers, chills or abnormal night sweats Eyes: Denies blurriness of vision, double vision or watery eyes Ears, nose, mouth, throat, and face: Denies mucositis or sore throat Respiratory: Denies cough, dyspnea or wheezes Cardiovascular: Denies palpitation, chest discomfort or lower extremity swelling Gastrointestinal:  Denies nausea, heartburn or change in bowel habits Skin: Denies abnormal skin rashes Lymphatics: Denies new lymphadenopathy or easy bruising Neurological:Denies numbness, tingling or new weaknesses Behavioral/Psych: Mood is stable, no new changes  All other systems were reviewed with the patient and are negative.  MEDICAL HISTORY:  Past Medical History:  Diagnosis Date   Anxiety    Depression    SVT (supraventricular tachycardia) (HCC)     SURGICAL HISTORY: Past Surgical History:  Procedure Laterality Date   ABLATION  2021   CLEFT LIP REPAIR     2009   CLEFT PALATE REPAIR     2009   TONSILLECTOMY AND ADENOIDECTOMY     2016   TYMPANOSTOMY TUBE  PLACEMENT      SOCIAL HISTORY: Social History   Socioeconomic History   Marital status: Married    Spouse name: Not on file   Number of children: 1   Years of education: Not on file   Highest education level: Not on file  Occupational History   Not on file  Tobacco Use   Smoking status: Never   Smokeless tobacco: Never  Vaping Use   Vaping Use: Never used  Substance and Sexual Activity   Alcohol use: Never   Drug use: Never   Sexual activity: Yes    Birth control/protection: None  Other Topics Concern   Not on file  Social History Narrative   Right handed   Social Determinants of Health   Financial Resource Strain: Not on file  Food Insecurity: Not on file  Transportation Needs: Not on file  Physical Activity: Not on file  Stress: Not on file  Social Connections: Not on file  Intimate Partner Violence: Not on file    FAMILY HISTORY: Family History  Problem Relation Age of Onset   Multiple sclerosis Mother    Anxiety disorder Mother    Cancer Mother 65       unsure of type   Diabetes Brother     ALLERGIES:  has No Known Allergies.  MEDICATIONS:  Current Outpatient Medications  Medication Sig Dispense Refill   busPIRone (BUSPAR) 7.5 MG tablet Take 1 tablet (7.5 mg total) by mouth 3 (three) times daily. 90 tablet 1   Rimegepant Sulfate (NURTEC) 75 MG TBDP Take 75 mg by mouth daily as needed (Maximum 1  tablet in 24 hours). 16 tablet 5   sertraline (ZOLOFT) 100 MG tablet Take 1.5 tablets (150 mg total) by mouth daily. (Patient taking differently: Take 200 mg by mouth daily. Take one tablet daily) 180 tablet 1   Current Facility-Administered Medications  Medication Dose Route Frequency Provider Last Rate Last Admin   lidocaine-EPINEPHrine (XYLOCAINE W/EPI) 1 %-1:100000 (with pres) injection 10 mL  10 mL Infiltration Once Croitoru, [name], MD         PHYSICAL EXAMINATION: ECOG PERFORMANCE STATUS: 1 - Symptomatic but completely ambulatory  There were no  vitals filed for this visit.  There were no vitals filed for this visit.   GENERAL:alert, no distress and comfortable SKIN: skin color, texture, turgor are normal, no rashes or significant lesions EYES: normal, conjunctiva are pink and non-injected, sclera clear OROPHARYNX:no exudate, no erythema and lips, buccal mucosa, and tongue normal  NECK: supple, thyroid normal size, non-tender, without nodularity LYMPH:  no palpable lymphadenopathy in the cervical, axillary or inguinal LUNGS: clear to auscultation and percussion with normal breathing effort HEART: regular rate & rhythm and no murmurs and no lower extremity edema ABDOMEN:abdomen soft, non-tender and normal bowel sounds Musculoskeletal:no cyanosis of digits and no clubbing  PSYCH: alert & oriented x 3 with fluent speech NEURO: no focal motor/sensory deficits  LABORATORY DATA:  I have reviewed the data as listed Lab Results  Component Value Date   WBC 8.1 12/02/2020   HGB 12.8 12/02/2020   HCT 39.5 12/02/2020   MCV 83.2 12/02/2020   PLT 309 12/02/2020     Chemistry      Component Value Date/Time   NA 134 (L) 09/28/2020 1106   K 4.4 09/28/2020 1106   CL 100 09/28/2020 1106   CO2 26 09/28/2020 1106   BUN 11 09/28/2020 1106   CREATININE 0.74 09/28/2020 1106      Component Value Date/Time   CALCIUM 9.8 09/28/2020 1106   ALKPHOS 67 09/28/2020 1106   AST 16 09/28/2020 1106   ALT 9 09/28/2020 1106   BILITOT 0.5 09/28/2020 1106       RADIOGRAPHIC STUDIES: I have personally reviewed the radiological images as listed and agreed with the findings in the report. No results found.  All questions were answered. The patient knows to call the clinic with any problems, questions or concerns.     Rachel Moulds, MD 06/01/2021 8:42 PM

## 2021-06-02 ENCOUNTER — Inpatient Hospital Stay: Attending: Hematology and Oncology | Admitting: Hematology and Oncology

## 2021-09-09 ENCOUNTER — Telehealth: Payer: Self-pay | Admitting: Hematology and Oncology

## 2021-09-09 NOTE — Telephone Encounter (Signed)
.  Called pt per 3/31 inbasket , Patient was unavailable, a message with appt time and date was left with number on file.    ?

## 2021-09-20 ENCOUNTER — Ambulatory Visit: Admitting: Hematology and Oncology

## 2021-09-30 ENCOUNTER — Inpatient Hospital Stay: Attending: Hematology and Oncology | Admitting: Hematology and Oncology

## 2021-09-30 ENCOUNTER — Inpatient Hospital Stay (HOSPITAL_BASED_OUTPATIENT_CLINIC_OR_DEPARTMENT_OTHER): Admitting: Hematology and Oncology

## 2021-09-30 ENCOUNTER — Encounter: Payer: Self-pay | Admitting: Hematology and Oncology

## 2021-09-30 VITALS — BP 131/79 | HR 91 | Temp 97.0°F | Resp 18 | Wt 184.3 lb

## 2021-09-30 DIAGNOSIS — F419 Anxiety disorder, unspecified: Secondary | ICD-10-CM | POA: Insufficient documentation

## 2021-09-30 DIAGNOSIS — O99013 Anemia complicating pregnancy, third trimester: Secondary | ICD-10-CM | POA: Diagnosis present

## 2021-09-30 DIAGNOSIS — D509 Iron deficiency anemia, unspecified: Secondary | ICD-10-CM | POA: Insufficient documentation

## 2021-09-30 DIAGNOSIS — Z3A29 29 weeks gestation of pregnancy: Secondary | ICD-10-CM | POA: Diagnosis not present

## 2021-09-30 DIAGNOSIS — F32A Depression, unspecified: Secondary | ICD-10-CM | POA: Insufficient documentation

## 2021-09-30 DIAGNOSIS — D500 Iron deficiency anemia secondary to blood loss (chronic): Secondary | ICD-10-CM

## 2021-09-30 DIAGNOSIS — Z79899 Other long term (current) drug therapy: Secondary | ICD-10-CM | POA: Insufficient documentation

## 2021-09-30 LAB — CBC WITH DIFFERENTIAL/PLATELET
Abs Immature Granulocytes: 0.07 10*3/uL (ref 0.00–0.07)
Basophils Absolute: 0 10*3/uL (ref 0.0–0.1)
Basophils Relative: 0 %
Eosinophils Absolute: 0.1 10*3/uL (ref 0.0–0.5)
Eosinophils Relative: 1 %
HCT: 35.2 % — ABNORMAL LOW (ref 36.0–46.0)
Hemoglobin: 11.7 g/dL — ABNORMAL LOW (ref 12.0–15.0)
Immature Granulocytes: 1 %
Lymphocytes Relative: 15 %
Lymphs Abs: 1.9 10*3/uL (ref 0.7–4.0)
MCH: 28.8 pg (ref 26.0–34.0)
MCHC: 33.2 g/dL (ref 30.0–36.0)
MCV: 86.7 fL (ref 80.0–100.0)
Monocytes Absolute: 0.7 10*3/uL (ref 0.1–1.0)
Monocytes Relative: 6 %
Neutro Abs: 9.8 10*3/uL — ABNORMAL HIGH (ref 1.7–7.7)
Neutrophils Relative %: 77 %
Platelets: 277 10*3/uL (ref 150–400)
RBC: 4.06 MIL/uL (ref 3.87–5.11)
RDW: 12.8 % (ref 11.5–15.5)
WBC: 12.6 10*3/uL — ABNORMAL HIGH (ref 4.0–10.5)
nRBC: 0 % (ref 0.0–0.2)

## 2021-09-30 LAB — FERRITIN: Ferritin: 8 ng/mL — ABNORMAL LOW (ref 11–307)

## 2021-09-30 LAB — IRON AND IRON BINDING CAPACITY (CC-WL,HP ONLY)
Iron: 40 ug/dL (ref 28–170)
Saturation Ratios: 7 % — ABNORMAL LOW (ref 10.4–31.8)
TIBC: 547 ug/dL — ABNORMAL HIGH (ref 250–450)
UIBC: 507 ug/dL — ABNORMAL HIGH (ref 148–442)

## 2021-09-30 NOTE — Progress Notes (Signed)
[name], [name], [name], [name], [name], [name], [name], [name], MD      ? ? ? ?PHYSICAL EXAMINATION: ?ECOG PERFORMANCE STATUS: 1 - Symptomatic but completely ambulatory ? ?Vitals:  ? 09/30/21 1128  ?BP: 131/79  ?Pulse: 91  ?Resp: 18  ?Temp: (!) 97 ?F (36.1 ?C)  ?SpO2: 98%  ? ?Filed Weights  ? 09/30/21 1128  ?Weight: 184 lb 5 oz (83.6 kg)  ? ?Physical Exam ?Constitutional:   ?   Appearance: Normal appearance.  ?Cardiovascular:  ?   Rate and Rhythm: Normal rate and regular rhythm.  ?   Pulses: Normal pulses.  ?   Heart sounds: Normal heart sounds.  ?Pulmonary:  ?   Effort: Pulmonary effort is normal.  ?   Breath sounds: Normal breath sounds.  ?Abdominal:  ?   General: Abdomen is flat. Bowel sounds are normal.  ?   Palpations: Abdomen is soft.  ?Musculoskeletal:  ?   Cervical back: Normal range of motion and neck supple. No rigidity.  ?Neurological:  ?   General: No focal deficit present.  ?   Mental Status: She is alert.  ?Psychiatric:     ?   Mood and Affect: Mood normal.  ? ? ?LABORATORY DATA:  ?I have reviewed the data as listed ?Lab Results  ?Component Value Date  ? WBC 8.1 12/02/2020  ? HGB 12.8 12/02/2020  ? HCT 39.5 12/02/2020  ? MCV 83.2 12/02/2020  ? PLT 309 12/02/2020  ? ?  Chemistry   ?   ?Component Value Date/Time  ? NA 134 (L) 09/28/2020 1106  ? K 4.4 09/28/2020 1106  ? CL 100 09/28/2020 1106  ? CO2 26 09/28/2020 1106  ? BUN 11 09/28/2020 1106  ? CREATININE 0.74 09/28/2020 1106  ?    ?Component Value Date/Time  ? CALCIUM 9.8 09/28/2020 1106  ? ALKPHOS 67 09/28/2020 1106  ? AST 16 09/28/2020 1106  ? ALT 9 09/28/2020 1106  ? BILITOT 0.5 09/28/2020 1106  ?  ? ? ? ?RADIOGRAPHIC STUDIES: ?I have personally reviewed the radiological images as listed and agreed with the findings in the report. ?No results found. ? ?All questions were answered. The patient knows to call the clinic with any problems, [name], review of records, counseling and coordination of care. ? ?  ? Rachel Moulds, MD ?09/30/2021 11:39 AM ? ? ? ? ?

## 2021-10-03 ENCOUNTER — Encounter: Payer: Self-pay | Admitting: Hematology and Oncology

## 2021-10-03 NOTE — Progress Notes (Signed)
Venofers orders placed ?

## 2021-10-04 ENCOUNTER — Encounter: Payer: Self-pay | Admitting: Hematology and Oncology

## 2021-10-05 ENCOUNTER — Telehealth: Payer: Self-pay | Admitting: Hematology and Oncology

## 2021-10-05 NOTE — Telephone Encounter (Signed)
.  Called patient to schedule appointment per 4/26 inbasket, left msg ?

## 2021-10-14 ENCOUNTER — Other Ambulatory Visit: Payer: Self-pay

## 2021-10-14 ENCOUNTER — Inpatient Hospital Stay: Attending: Hematology and Oncology

## 2021-10-14 ENCOUNTER — Other Ambulatory Visit: Payer: Self-pay | Admitting: *Deleted

## 2021-10-14 VITALS — BP 129/67 | HR 79 | Temp 98.1°F | Resp 16

## 2021-10-14 DIAGNOSIS — O99013 Anemia complicating pregnancy, third trimester: Secondary | ICD-10-CM | POA: Insufficient documentation

## 2021-10-14 DIAGNOSIS — D509 Iron deficiency anemia, unspecified: Secondary | ICD-10-CM | POA: Diagnosis present

## 2021-10-14 DIAGNOSIS — D500 Iron deficiency anemia secondary to blood loss (chronic): Secondary | ICD-10-CM

## 2021-10-14 MED ORDER — SODIUM CHLORIDE 0.9 % IV SOLN: INTRAVENOUS | Status: DC

## 2021-10-14 MED ORDER — SODIUM CHLORIDE 0.9 % IV SOLN
200.0000 mg | Freq: Once | INTRAVENOUS | Status: AC
  Administered 2021-10-14: 200 mg via INTRAVENOUS
  Filled 2021-10-14: qty 200

## 2021-10-14 NOTE — Patient Instructions (Signed)

## 2021-10-14 NOTE — Progress Notes (Signed)
Fetal heart tones prior to infusion:  153 ? ?Post fetal heart tones:  147.  No complaints from pt, VSS, pt observed 30 minutes post infusion.     ?

## 2021-10-17 ENCOUNTER — Encounter: Payer: Self-pay | Admitting: Hematology and Oncology

## 2021-10-17 NOTE — Progress Notes (Signed)
Iron orders plan placed ?

## 2021-10-18 ENCOUNTER — Telehealth: Payer: Self-pay | Admitting: Hematology and Oncology

## 2021-10-18 NOTE — Telephone Encounter (Signed)
.  Called patient to schedule appointment per 5.9 inbasket, patient is aware of date and time.   ?

## 2021-10-19 ENCOUNTER — Inpatient Hospital Stay

## 2021-10-19 ENCOUNTER — Other Ambulatory Visit: Payer: Self-pay

## 2021-10-19 VITALS — BP 117/76 | HR 94 | Temp 98.0°F | Resp 18

## 2021-10-19 DIAGNOSIS — D500 Iron deficiency anemia secondary to blood loss (chronic): Secondary | ICD-10-CM

## 2021-10-19 DIAGNOSIS — D509 Iron deficiency anemia, unspecified: Secondary | ICD-10-CM | POA: Diagnosis not present

## 2021-10-19 MED ORDER — SODIUM CHLORIDE 0.9 % IV SOLN
200.0000 mg | Freq: Once | INTRAVENOUS | Status: AC
  Administered 2021-10-19: 200 mg via INTRAVENOUS
  Filled 2021-10-19: qty 10

## 2021-10-19 MED ORDER — SODIUM CHLORIDE 0.9 % IV SOLN: INTRAVENOUS | Status: DC

## 2021-10-19 NOTE — Patient Instructions (Signed)

## 2021-10-19 NOTE — Progress Notes (Signed)
Pt observed for 30 minutes post Venofer infusion with no issues. VSS. Ambulatory with no complaints at time of discharge.  ? ?Fetal heart tones assessed using doppler.  ?FHR prior to transfusion was 157. ?FHR post transfusion was 158.  ?

## 2021-10-21 ENCOUNTER — Inpatient Hospital Stay

## 2021-10-28 ENCOUNTER — Other Ambulatory Visit: Payer: Self-pay

## 2021-10-28 ENCOUNTER — Inpatient Hospital Stay

## 2021-10-28 VITALS — BP 126/74 | HR 98 | Temp 97.8°F | Resp 18

## 2021-10-28 DIAGNOSIS — D509 Iron deficiency anemia, unspecified: Secondary | ICD-10-CM | POA: Diagnosis not present

## 2021-10-28 DIAGNOSIS — D500 Iron deficiency anemia secondary to blood loss (chronic): Secondary | ICD-10-CM

## 2021-10-28 MED ORDER — SODIUM CHLORIDE 0.9 % IV SOLN: Freq: Once | INTRAVENOUS | Status: AC

## 2021-10-28 MED ORDER — SODIUM CHLORIDE 0.9 % IV SOLN
200.0000 mg | Freq: Once | INTRAVENOUS | Status: AC
  Administered 2021-10-28: 200 mg via INTRAVENOUS
  Filled 2021-10-28: qty 200

## 2022-01-30 ENCOUNTER — Encounter: Payer: Self-pay | Admitting: Hematology and Oncology

## 2022-01-30 ENCOUNTER — Inpatient Hospital Stay: Attending: Hematology and Oncology | Admitting: Hematology and Oncology

## 2022-01-30 ENCOUNTER — Inpatient Hospital Stay

## 2022-01-30 ENCOUNTER — Other Ambulatory Visit: Payer: Self-pay

## 2022-01-30 VITALS — BP 127/84 | HR 74 | Temp 97.7°F | Resp 16 | Ht 67.0 in | Wt 171.6 lb

## 2022-01-30 DIAGNOSIS — D500 Iron deficiency anemia secondary to blood loss (chronic): Secondary | ICD-10-CM

## 2022-01-30 DIAGNOSIS — D509 Iron deficiency anemia, unspecified: Secondary | ICD-10-CM | POA: Diagnosis present

## 2022-01-30 LAB — CBC WITH DIFFERENTIAL/PLATELET
Abs Immature Granulocytes: 0.01 10*3/uL (ref 0.00–0.07)
Basophils Absolute: 0.1 10*3/uL (ref 0.0–0.1)
Basophils Relative: 1 %
Eosinophils Absolute: 0.1 10*3/uL (ref 0.0–0.5)
Eosinophils Relative: 2 %
HCT: 44.3 % (ref 36.0–46.0)
Hemoglobin: 14.8 g/dL (ref 12.0–15.0)
Immature Granulocytes: 0 %
Lymphocytes Relative: 34 %
Lymphs Abs: 2.7 10*3/uL (ref 0.7–4.0)
MCH: 28.2 pg (ref 26.0–34.0)
MCHC: 33.4 g/dL (ref 30.0–36.0)
MCV: 84.4 fL (ref 80.0–100.0)
Monocytes Absolute: 0.4 10*3/uL (ref 0.1–1.0)
Monocytes Relative: 5 %
Neutro Abs: 4.7 10*3/uL (ref 1.7–7.7)
Neutrophils Relative %: 58 %
Platelets: 324 10*3/uL (ref 150–400)
RBC: 5.25 MIL/uL — ABNORMAL HIGH (ref 3.87–5.11)
RDW: 14.6 % (ref 11.5–15.5)
WBC: 8.1 10*3/uL (ref 4.0–10.5)
nRBC: 0 % (ref 0.0–0.2)

## 2022-01-30 LAB — FERRITIN: Ferritin: 55 ng/mL (ref 11–307)

## 2022-01-30 LAB — IRON AND IRON BINDING CAPACITY (CC-WL,HP ONLY)
Iron: 109 ug/dL (ref 28–170)
Saturation Ratios: 30 % (ref 10.4–31.8)
TIBC: 367 ug/dL (ref 250–450)
UIBC: 258 ug/dL (ref 148–442)

## 2022-01-30 NOTE — Progress Notes (Signed)
[facility] CONSULT NOTE  Patient Care Team: Nche, [name], NP as PCP - General (Internal Medicine)  CHIEF COMPLAINTS/PURPOSE OF CONSULTATION:  IDA  ASSESSMENT & PLAN:   This is a very pleasant 26 year old female patient with no significant past medical history except for anxiety and depression referred to hematology for worsening iron deficiency and intolerance to oral iron. She will save the iron in May 2023 and delivered a healthy baby girl in June 2023.  She is doing quite well except for some fatigue which could very well be postpartum.  Physical examination today unremarkable.  CBC from today shows resolution of anemia and iron deficiency.  I encouraged her to stay on prenatal vitamin as long as she is breast-feeding.  She expressed understanding of the recommendations. She can return to clinic in 6 months.  HIST ORY OF PRESENTING ILLNESS:   Shelby Castillo 26 y.o. female is here because of IDA  This is a very pleasant 26 year old female patient with past medical history significant for anxiety and depression referred to hematology for evaluation of iron deficiency anemia.   She received  iron in May 2023, delivered healthy baby girl in June 2023. She feels well except fatigue. She didn't report excessive post partum bleeding Rest of the pertinent 10 point ROS reviewed and negative  MEDICAL HISTORY:  Past Medical History:  Diagnosis Date   Anxiety    Depression    SVT (supraventricular tachycardia) (HCC)     SURGICAL HISTORY: Past Surgical History:  Procedure Laterality Date   ABLATION  2021   CLEFT LIP REPAIR     2009   CLEFT PALATE REPAIR     2009   TONSILLECTOMY AND ADENOIDECTOMY     2016   TYMPANOSTOMY TUBE PLACEMENT      SOCIAL HISTORY: Social History   Socioeconomic History   Marital status: Married    Spouse name: Not on file   Number of children: 1   Years of education: Not on file   Highest education level: Not on file   Occupational History   Not on file  Tobacco Use   Smoking status: Never   Smokeless tobacco: Never  Vaping Use   Vaping Use: Never used  Substance and Sexual Activity   Alcohol use: Never   Drug use: Never   Sexual activity: Yes    Birth control/protection: None  Other Topics Concern   Not on file  Social History Narrative   Right handed   Social Determinants of Health   Financial Resource Strain: Not on file  Food Insecurity: Not on file  Transportation Needs: Not on file  Physical Activity: Not on file  Stress: Not on file  Social Connections: Not on file  Intimate Partner Violence: Not on file    FAMILY HISTORY: Family History  Problem Relation Age of Onset   Multiple sclerosis Mother    Anxiety disorder Mother    Cancer Mother 51       unsure of type   Diabetes Brother     ALLERGIES:  has No Known Allergies.  MEDICATIONS:  Current Outpatient Medications  Medication Sig Dispense Refill   busPIRone (BUSPAR) 7.5 MG tablet Take 1 tablet (7.5 mg total) by mouth 3 (three) times daily. 90 tablet 1   Rimegepant Sulfate (NURTEC) 75 MG TBDP Take 75 mg by mouth daily as needed (Maximum 1 tablet in 24 hours). 16 tablet 5   sertraline (ZOLOFT) 100 MG tablet Take 1.5 tablets (150 mg total) by  mouth daily. (Patient taking differently: Take 200 mg by mouth daily. Take one tablet daily) 180 tablet 1   Current Facility-Administered Medications  Medication Dose Route Frequency Provider Last Rate Last Admin   lidocaine-EPINEPHrine (XYLOCAINE W/EPI) 1 %-1:100000 (with pres) injection 10 mL  10 mL Infiltration Once Croitoru, [name], MD         PHYSICAL EXAMINATION: ECOG PERFORMANCE STATUS: 1 - Symptomatic but completely ambulatory  Vitals:   01/30/22 1311  BP: 127/84  Pulse: 74  Resp: 16  Temp: 97.7 F (36.5 C)  SpO2: 99%   Filed Weights   01/30/22 1311  Weight: 171 lb 9.6 oz (77.8 kg)   Physical Exam Constitutional:      Appearance: Normal appearance.   Cardiovascular:     Rate and Rhythm: Normal rate and regular rhythm.     Pulses: Normal pulses.     Heart sounds: Normal heart sounds.  Pulmonary:     Effort: Pulmonary effort is normal.     Breath sounds: Normal breath sounds.  Abdominal:     General: Abdomen is flat. Bowel sounds are normal.     Palpations: Abdomen is soft.  Musculoskeletal:     Cervical back: Normal range of motion and neck supple. No rigidity.  Neurological:     General: No focal deficit present.     Mental Status: She is alert.  Psychiatric:        Mood and Affect: Mood normal.     LABORATORY DATA:  I have reviewed the data as listed Lab Results  Component Value Date   WBC 12.6 (H) 09/30/2021   HGB 11.7 (L) 09/30/2021   HCT 35.2 (L) 09/30/2021   MCV 86.7 09/30/2021   PLT 277 09/30/2021     Chemistry      Component Value Date/Time   NA 134 (L) 09/28/2020 1106   K 4.4 09/28/2020 1106   CL 100 09/28/2020 1106   CO2 26 09/28/2020 1106   BUN 11 09/28/2020 1106   CREATININE 0.74 09/28/2020 1106      Component Value Date/Time   CALCIUM 9.8 09/28/2020 1106   ALKPHOS 67 09/28/2020 1106   AST 16 09/28/2020 1106   ALT 9 09/28/2020 1106   BILITOT 0.5 09/28/2020 1106     CBC today showed a hemoglobin of 14.8 g/dL, normal white blood cell count and platelet count.  Iron panel normal.  RADIOGRAPHIC STUDIES: I have personally reviewed the radiological images as listed and agreed with the findings in the report. No results found.  All questions were answered. The patient knows to call the clinic with any problems, questions or concerns. I spent 30 minutes in the care of this patient including H and P, review of records, counseling and coordination of care.     Rachel Moulds, MD 01/30/2022 1:14 PM

## 2022-04-13 IMAGING — MR MR HEAD WO/W CM
13 series · 48 of 48 positions shown · IV contrast (multihance)
Comparison: None.

CLINICAL DATA: Chronic headache

EXAM:
MRI HEAD WITHOUT AND WITH CONTRAST
TECHNIQUE: Multiplanar, multiecho pulse sequences of the brain and surrounding
structures were obtained without and with intravenous contrast.
CONTRAST:  15mL MULTIHANCE GADOBENATE DIMEGLUMINE 529 MG/ML IV SOLN

[Series 2: T1 · sagittal · 5.0mm · 0.45mm/px · 2 of 21 slices shown]
[im 1/21]
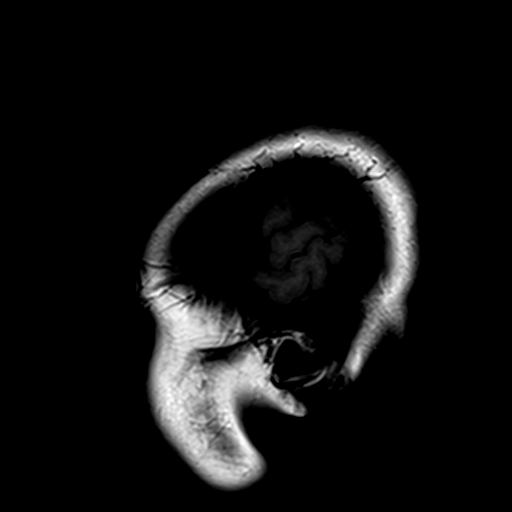
[im 21/21]
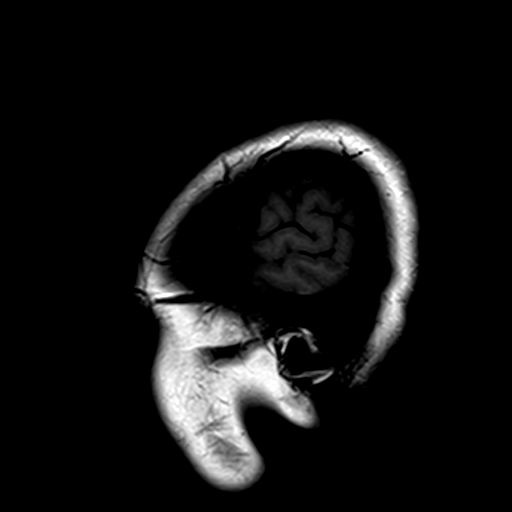

[Series 3: DWI · axial · 3.0mm · 1.80mm/px · z∈[-62,+85]mm · 8 of 100 slices shown (1 of 4)]
[im 1/100]
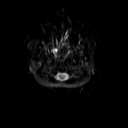
[im 15/100]
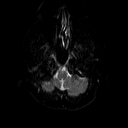
[im 29/100]
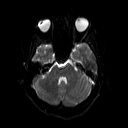
[im 43/100]
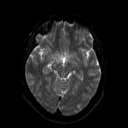
[im 57/100]
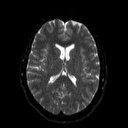
[im 71/100]
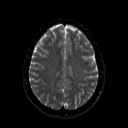
[im 85/100]
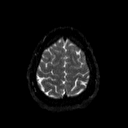
[im 100/100]
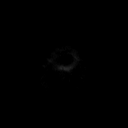

[Series 4: DWI · axial · 3.0mm · 1.80mm/px · z∈[-62,+85]mm · 3 of 50 slices shown (2 of 4)]
[im 1/50]
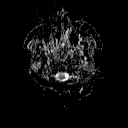
[im 25/50]
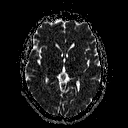
[im 50/50]
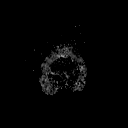

[Series 5: DWI · coronal · 5.0mm · 1.80mm/px · 5 of 70 slices shown (3 of 4)]
[im 1/70]
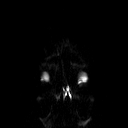
[im 18/70]
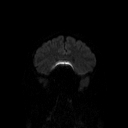
[im 35/70]
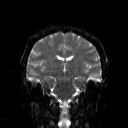
[im 52/70]
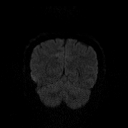
[im 70/70]
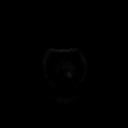

[Series 6: DWI · coronal · 5.0mm · 1.80mm/px · 2 of 36 slices shown (4 of 4)]
[im 1/36]
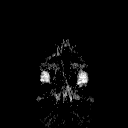
[im 36/36]
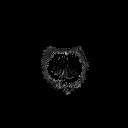

[Series 7: T2 · axial · 5.0mm · 0.90mm/px · 1 of 22 slices shown (1 of 2)]
[im 1/22]
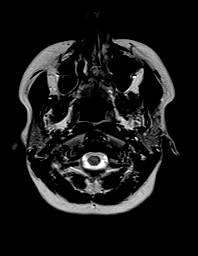

[Series 8: FLAIR · axial · 3.0mm · 0.45mm/px · z∈[-56,+79]mm · 2 of 30 slices shown]
[im 1/30]
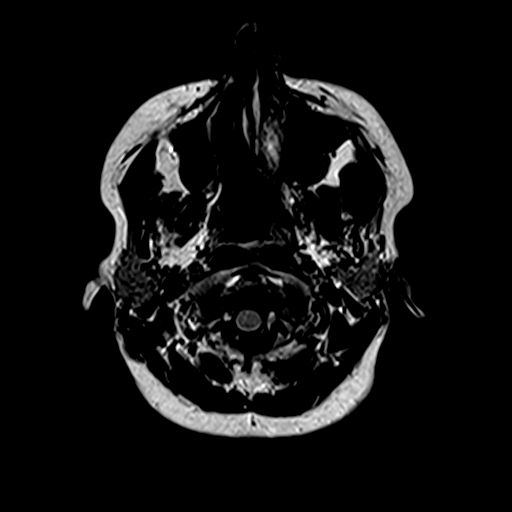
[im 30/30]
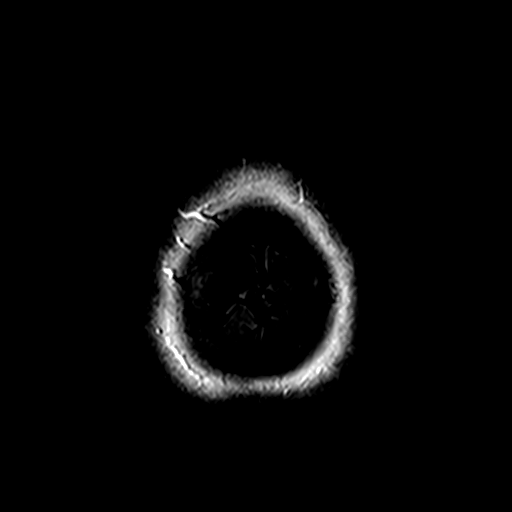

[Series 10: swi_images · axial · 4.0mm · 0.90mm/px · z∈[-58,+82]mm · 2 of 36 slices shown]
[im 1/36]
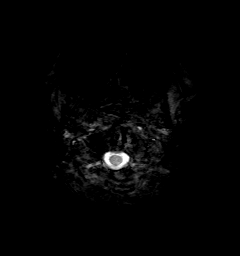
[im 36/36]
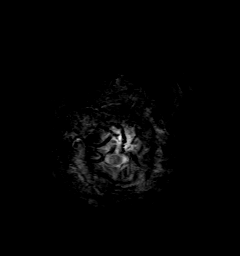

[Series 11: t1_mpr_tra · axial · 1.0mm · 0.75mm/px · z∈[-59,+83]mm · 9 of 144 slices shown]
[im 1/144]
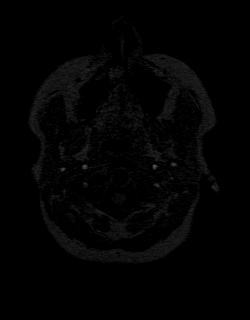
[im 18/144]
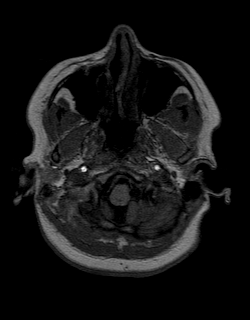
[im 36/144]
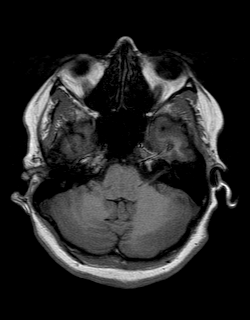
[im 54/144]
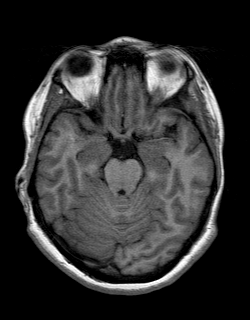
[im 72/144]
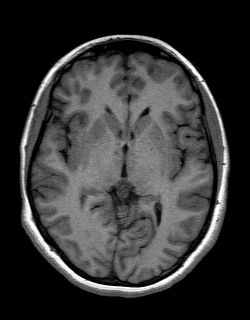
[im 90/144]
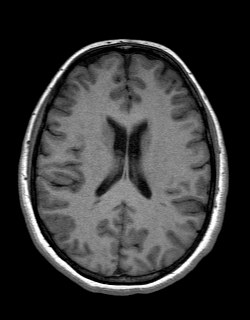
[im 108/144]
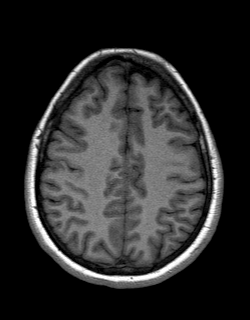
[im 126/144]
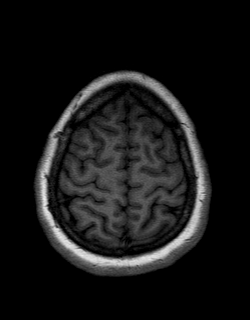
[im 144/144]
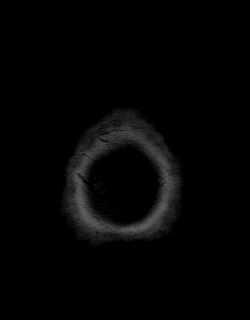

[Series 12: T2 · coronal · 5.0mm · 0.45mm/px · 2 of 29 slices shown (2 of 2)]
[im 1/29]
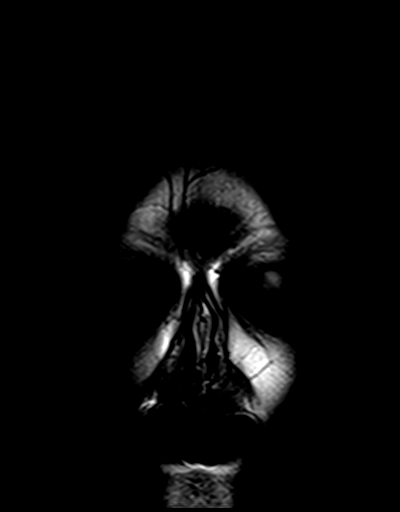
[im 29/29]
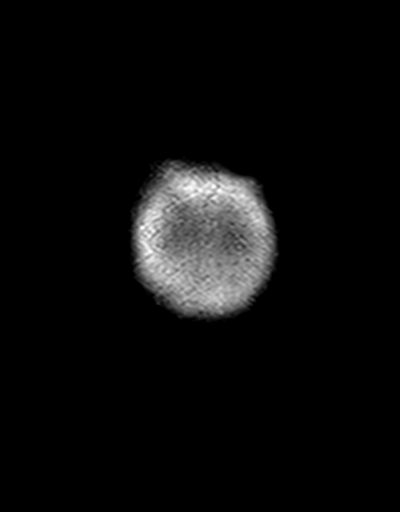

[Series 13: t1_mpr_tra post · axial · 1.0mm · 0.75mm/px · z∈[-59,+83]mm · 9 of 144 slices shown]
[im 1/144]
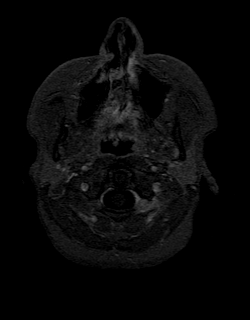
[im 18/144]
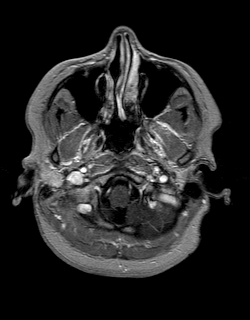
[im 36/144]
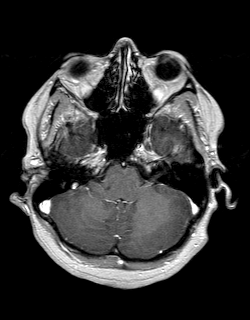
[im 54/144]
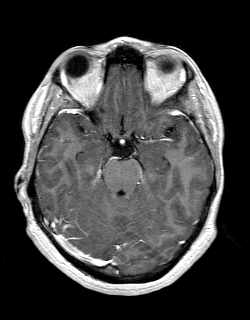
[im 72/144]
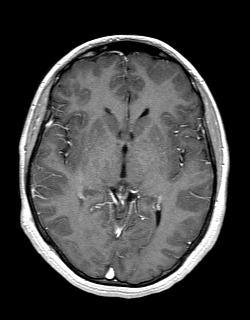
[im 90/144]
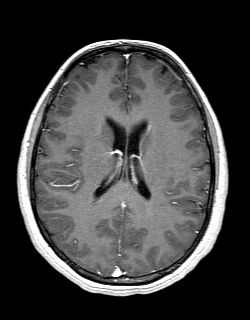
[im 108/144]
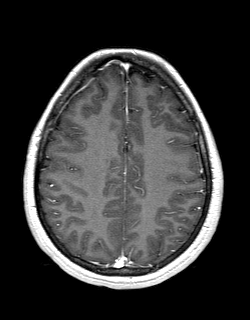
[im 126/144]
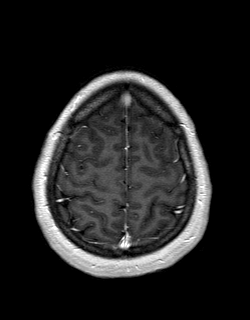
[im 144/144]
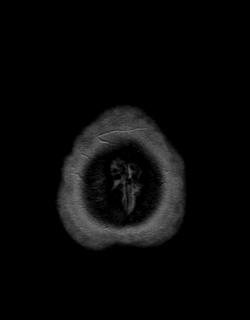

[Series 14: post cor · coronal · 5.0mm · 0.45mm/px · 2 of 29 slices shown]
[im 1/29]
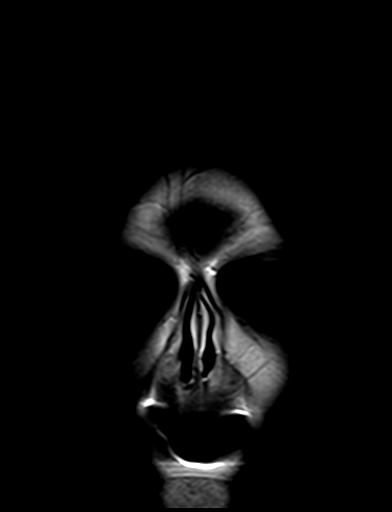
[im 29/29]
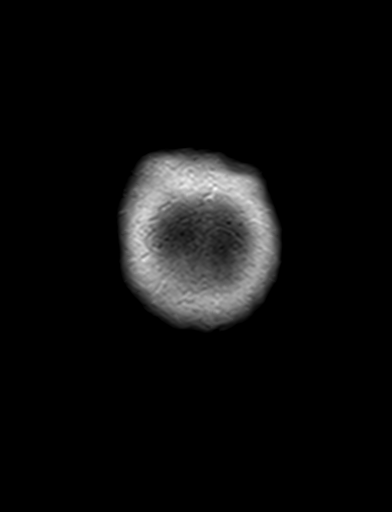

[Series 15: T1 post-contrast · sagittal · 5.0mm · 0.45mm/px · 1 of 21 slices shown]
[im 1/21]
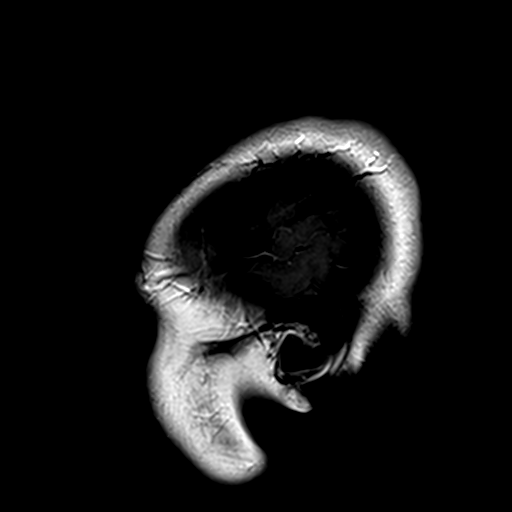

[48 of 48 positions shown; findings below may reference images not displayed]

FINDINGS: Brain: No acute infarction, hemorrhage, hydrocephalus, extra-axial
collection or mass lesion. Normal white matter. History of abnormal
pineal gland on prior MRI. Pineal gland is not enlarged on today's
study. No mass-effect on the tectum.

Vascular: Normal arterial flow voids

Skull and upper cervical spine: No focal skeletal abnormality.

Sinuses/Orbits: Negative

Other: None
IMPRESSION: Normal MRI head with contrast.

## 2022-05-02 ENCOUNTER — Telehealth: Payer: Self-pay | Admitting: Nurse Practitioner

## 2022-05-02 ENCOUNTER — Ambulatory Visit: Admitting: Nurse Practitioner

## 2022-05-02 NOTE — Telephone Encounter (Signed)
11.21.23 no show letter sent 

## 2022-05-12 NOTE — Telephone Encounter (Signed)
1st no show, fee waived, letter sent 

## 2022-06-01 ENCOUNTER — Ambulatory Visit: Admitting: Hematology and Oncology

## 2022-08-03 ENCOUNTER — Inpatient Hospital Stay: Admitting: Hematology and Oncology

## 2022-08-15 ENCOUNTER — Inpatient Hospital Stay: Admitting: Hematology and Oncology

## 2022-08-15 ENCOUNTER — Telehealth: Payer: Self-pay | Admitting: Hematology and Oncology

## 2022-08-15 NOTE — Telephone Encounter (Signed)
Left patient a vm regarding appointment change  

## 2022-08-25 ENCOUNTER — Ambulatory Visit: Admitting: Hematology and Oncology
